# Patient Record
Sex: Female | Born: 1995 | Race: White | Hispanic: Yes | Marital: Single | Smoking: Never smoker
Health system: Southern US, Community
[De-identification: ages and names within clinical notes are randomized; demographics above are authoritative.]

## PROBLEM LIST (undated history)

## (undated) DIAGNOSIS — O139 Gestational [pregnancy-induced] hypertension without significant proteinuria, unspecified trimester: Secondary | ICD-10-CM

## (undated) DIAGNOSIS — O1494 Unspecified pre-eclampsia, complicating childbirth: Secondary | ICD-10-CM

## (undated) DIAGNOSIS — Z8759 Personal history of other complications of pregnancy, childbirth and the puerperium: Secondary | ICD-10-CM

## (undated) HISTORY — DX: Unspecified pre-eclampsia, complicating childbirth: O14.94

## (undated) HISTORY — DX: Gestational (pregnancy-induced) hypertension without significant proteinuria, unspecified trimester: O13.9

## (undated) HISTORY — DX: Personal history of other complications of pregnancy, childbirth and the puerperium: Z87.59

---

## 2018-08-17 LAB — HM HIV SCREENING LAB: HM HIV Screening: NEGATIVE

## 2019-02-02 ENCOUNTER — Ambulatory Visit (LOCAL_COMMUNITY_HEALTH_CENTER): Payer: Self-pay | Admitting: Physician Assistant

## 2019-02-02 ENCOUNTER — Other Ambulatory Visit: Payer: Self-pay

## 2019-02-02 ENCOUNTER — Encounter: Payer: Self-pay | Admitting: Physician Assistant

## 2019-02-02 VITALS — BP 105/65 | Ht 65.0 in | Wt 226.0 lb

## 2019-02-02 DIAGNOSIS — Z3046 Encounter for surveillance of implantable subdermal contraceptive: Secondary | ICD-10-CM

## 2019-02-02 DIAGNOSIS — Z3009 Encounter for other general counseling and advice on contraception: Secondary | ICD-10-CM

## 2019-02-02 DIAGNOSIS — Z309 Encounter for contraceptive management, unspecified: Secondary | ICD-10-CM

## 2019-02-02 MED ORDER — NORGESTIMATE-ETH ESTRADIOL 0.25-35 MG-MCG PO TABS: 1.0000 | ORAL_TABLET | Freq: Every day | ORAL | 0 refills | Status: DC

## 2019-02-02 NOTE — Progress Notes (Signed)
Family Planning Visit- Repeat Yearly Visit  Subjective:  Pamela Mack is a 23 y.o. being seen today for an well woman visit and to discuss family planning options.    She is currently using Nexplanon for pregnancy prevention. Patient reports she does not  want a pregnancy in the next year. Patient has the following medical conditionsdoes not have a problem list on file.  Chief Complaint  Patient presents with  . Contraception    irregular bleeding with Nexplanon    Patient reports that she got the Nexplanon placed about 3-4 months ago.  States that she has had light bleeding each day since insertion.  Reports that she has had irritation from using pads a lot.  Also, concerned that she has gained weight since insertion.  Is considering having Nexplanon removed.  Patient denies nausea, vomiting, abdominal pain, vaginal discharge, itching burning, change in soap/detergent and change of partner.   Does the patient desire a pregnancy in the next year? (OKQ flowsheet)  See flowsheet for other program required questions.   Body mass index is 37.61 kg/m. - Patient is eligible for diabetes screening based on BMI and age >14?  not applicable ER1V ordered? not applicable  Patient reports 1 of partners in last year. Desires STI screening?  No - .  Does the patient have a current or past history of drug use? No   No components found for: HCV]   Health Maintenance Due  Topic Date Due  . HIV Screening  07/28/2009  . TETANUS/TDAP  07/28/2013  . PAP-Cervical Cytology Screening  07/29/2015  . PAP SMEAR-Modifier  07/29/2015    Review of Systems  Constitutional: Negative.   HENT: Negative.   Cardiovascular: Negative.   Gastrointestinal: Negative.   Genitourinary: Negative.   Musculoskeletal: Negative.   Skin: Negative.   Neurological: Negative.   Psychiatric/Behavioral: Negative.     The following portions of the patient's history were reviewed and updated as appropriate:  allergies, current medications, past family history, past medical history, past social history, past surgical history and problem list. Problem list updated.  Objective:   Vitals:   02/02/19 1629  BP: 105/65  Weight: 226 lb (102.5 kg)  Height: 5\' 5"  (1.651 m)    Physical Exam Vitals signs reviewed.  Constitutional:      Appearance: Normal appearance.  HENT:     Head: Normocephalic and atraumatic.  Skin:    General: Skin is warm and dry.  Neurological:     Mental Status: She is alert and oriented to person, place, and time.  Psychiatric:        Mood and Affect: Mood normal.        Behavior: Behavior normal.    Patient declines pelvic exam today to assess for STDs.   Assessment and Plan:  Pamela Mack is a 23 y.o. female presenting to the [facility] Department for a family planning visit.  Contraception counseling: Reviewed all forms of birth control options available including abstinence; over the counter/barrier methods; hormonal contraceptive medication including pill, patch, ring, injection,contraceptive implant; hormonal and nonhormonal IUDs; permanent sterilization options including vasectomy and the various tubal sterilization modalities. Risks and benefits reviewed.  Questions were answered.  Written information was also given to the patient to review.  Patient desires to continue with Nexplanon as long as bleeding can be controlled, this was prescribed for patient. She will follow up in  3 months  for surveillance.  She was told to call with any further questions,  or with any concerns about this method of contraception.  Emphasized use of condoms 100% of the time for STI prevention.  There are no diagnoses linked to this encounter.  1.  Contraceptive counseling and management Counseled patient that it is normal to have irregular bleeding with the Nexplanon, especially for the first 6 months after insertion. Counseled that any BCM can affect appetite and  weight.  Rec well-balanced diet and regular exercise to maintain/lose weight. Counseled patient re:  Options to help with irregular bleeding and patient opts to try OCs for now Will give Sprintec 28d #3 1 po QD at the same time daily. Patient to start OCs today and rec take with food or milk to decrease nausea and dizziness as SE. Rec call back/ RTC for Nexplanon removal if continues to have irregular bleeding.  No follow-ups on file.  No future appointments.  Matt Holmesarla J Hampton, PA

## 2019-06-13 ENCOUNTER — Telehealth: Payer: Self-pay | Admitting: Family Medicine

## 2019-06-13 NOTE — Telephone Encounter (Signed)
Returned patient phone call with M. [place]), Astronomer. Patient states she had Nexplanon inserted here last January but now wants device removed. Patient states she has had irregular vaginal bleeding but now no period at all. States she has "belly cramps, nausea, dizziness and feelings of depression." Patient states she did not "have these things before." Patient states last sex was around 3 weeks ago. RN counseled patient to continue to not have sex until after completion of Nexplanon removal appt. Patient agreeable to above. States she would like to begin OCP's for alternate birth control. Nexplanon removal appt scheduled for 06/15/2019 @ 9:20. Instructed patient to arrive at 9:00 for check in. Hal Morales, RN

## 2019-06-13 NOTE — Telephone Encounter (Signed)
patient wants nexplanon removed

## 2019-06-15 ENCOUNTER — Ambulatory Visit (LOCAL_COMMUNITY_HEALTH_CENTER): Payer: Self-pay | Admitting: Family Medicine

## 2019-06-15 ENCOUNTER — Encounter: Payer: Self-pay | Admitting: Family Medicine

## 2019-06-15 ENCOUNTER — Other Ambulatory Visit: Payer: Self-pay

## 2019-06-15 VITALS — BP 118/70 | Ht 64.0 in | Wt 220.0 lb

## 2019-06-15 DIAGNOSIS — Z3046 Encounter for surveillance of implantable subdermal contraceptive: Secondary | ICD-10-CM

## 2019-06-15 DIAGNOSIS — Z3009 Encounter for other general counseling and advice on contraception: Secondary | ICD-10-CM

## 2019-06-15 DIAGNOSIS — Z30011 Encounter for initial prescription of contraceptive pills: Secondary | ICD-10-CM

## 2019-06-15 MED ORDER — NORGESTIM-ETH ESTRAD TRIPHASIC 0.18/0.215/0.25 MG-25 MCG PO TABS: 1.0000 | ORAL_TABLET | Freq: Every day | ORAL | 12 refills | Status: DC

## 2019-06-15 NOTE — Progress Notes (Signed)
Gave only #9 packs of Tri Lo Sprintec due to expire date/qty available. Pt to call for appt for OCP supply when she opens her last pack. Provider orders completed.

## 2019-06-15 NOTE — Progress Notes (Signed)
Last physical at [facility] and Nexplanon insertion at [place] was 08/17/2018. Pt desires Nexplanon removal, c/o belly has grown a lot and hurts a lot, having anxiety, and currently does not have a sexual partner. Last sex was 3 weeks ago. Declines condoms.

## 2019-06-15 NOTE — Progress Notes (Signed)
Nexplanon Removal Patient identified, informed consent performed, consent signed.   Appropriate time out taken. Nexplanon site identified.  Area prepped in usual sterile fashon. 3 ml of 1% lidocaine with Epinephrine was used to anesthetize the area at the distal end of the implant and along implant site. A small stab incision was made right beside the implant on the distal portion.  The Nexplanon rod was grasped manually and removed without difficulty.  There was minimal blood loss. There were no complications.  Steri-strips were applied over the small incision.  A pressure bandage was applied to reduce any bruising.  The patient tolerated the procedure well and was given post procedure instructions.  Nexplanon:   Counseled patient to take OTC analgesic starting as soon as lidocaine starts to wear off and take regularly for at least 48 hr to decrease discomfort.  Specifically to take with food or milk to decrease stomach upset and for IB 600 mg (3 tablets) every 6 hrs; IB 800 mg (4 tablets) every 8 hrs; or Aleve 2 tablets every 12 hrs. 1. General counseling and advice on contraceptive management   2. OCP (oral contraceptive pills) initiation  - Norgestimate-Ethinyl Estradiol Triphasic (ORTHO TRI-CYCLEN LO) 0.18/0.215/0.25 MG-25 MCG tab; Take 1 tablet by mouth daily.  Dispense: 1 Package; Refill: 12 Co to use condoms x 2 weeks for back up and always for STD prevention.

## 2019-08-29 ENCOUNTER — Other Ambulatory Visit: Payer: Self-pay

## 2019-08-29 ENCOUNTER — Ambulatory Visit (LOCAL_COMMUNITY_HEALTH_CENTER): Payer: Self-pay

## 2019-08-29 VITALS — BP 124/73 | Ht 64.0 in | Wt 216.0 lb

## 2019-08-29 DIAGNOSIS — Z3201 Encounter for pregnancy test, result positive: Secondary | ICD-10-CM

## 2019-08-29 MED ORDER — PRENATAL VITAMIN 27-0.8 MG PO TABS: 1.0000 | ORAL_TABLET | Freq: Every day | ORAL | 0 refills | Status: DC

## 2019-08-29 NOTE — Progress Notes (Signed)
Nexplanon removed at [facility] on11/18/2020 and per client, started ocps same day. Unable to remember if missed any ocps or took any late.Client did not start second pack of ocps in December because concerned was pregnant as having some dizziness and nausea. Per client took weight loss pills a few times when possibly pregnant. Encouraged to take to first [facility] appt for provider review. No NICR record available for client. Jossie Ng, RN

## 2019-08-30 LAB — PREGNANCY, URINE: Preg Test, Ur: POSITIVE — AB

## 2019-09-26 ENCOUNTER — Other Ambulatory Visit: Payer: Self-pay

## 2019-09-26 ENCOUNTER — Encounter: Payer: Self-pay | Admitting: Family Medicine

## 2019-09-26 ENCOUNTER — Ambulatory Visit: Payer: Medicaid Other | Admitting: Family Medicine

## 2019-09-26 VITALS — BP 112/65 | HR 85 | Temp 98.4°F | Wt 217.4 lb

## 2019-09-26 DIAGNOSIS — Z23 Encounter for immunization: Secondary | ICD-10-CM

## 2019-09-26 DIAGNOSIS — O09299 Supervision of pregnancy with other poor reproductive or obstetric history, unspecified trimester: Secondary | ICD-10-CM | POA: Diagnosis not present

## 2019-09-26 DIAGNOSIS — Z3480 Encounter for supervision of other normal pregnancy, unspecified trimester: Secondary | ICD-10-CM | POA: Diagnosis not present

## 2019-09-26 DIAGNOSIS — O99210 Obesity complicating pregnancy, unspecified trimester: Secondary | ICD-10-CM | POA: Diagnosis not present

## 2019-09-26 DIAGNOSIS — Z98891 History of uterine scar from previous surgery: Secondary | ICD-10-CM | POA: Insufficient documentation

## 2019-09-26 DIAGNOSIS — F439 Reaction to severe stress, unspecified: Secondary | ICD-10-CM

## 2019-09-26 DIAGNOSIS — O99211 Obesity complicating pregnancy, first trimester: Secondary | ICD-10-CM | POA: Insufficient documentation

## 2019-09-26 LAB — URINALYSIS
Bilirubin, UA: NEGATIVE
Glucose, UA: NEGATIVE
Ketones, UA: NEGATIVE
Leukocytes,UA: NEGATIVE
Nitrite, UA: NEGATIVE
Protein,UA: NEGATIVE
RBC, UA: NEGATIVE
Specific Gravity, UA: 1.02 (ref 1.005–1.030)
Urobilinogen, Ur: 0.2 mg/dL (ref 0.2–1.0)
pH, UA: 7 (ref 5.0–7.5)

## 2019-09-26 LAB — HEMOGLOBIN, FINGERSTICK: Hemoglobin: 12 g/dL (ref 11.1–15.9)

## 2019-09-26 LAB — WET PREP FOR TRICH, YEAST, CLUE
Trichomonas Exam: NEGATIVE
Yeast Exam: NEGATIVE

## 2019-09-26 LAB — HIV ANTIBODY (ROUTINE TESTING W REFLEX): HIV 1&2 Ab, 4th Generation: NONREACTIVE

## 2019-09-26 NOTE — Progress Notes (Signed)
Flu vaccine given, left deltoid, tolerated well, VIS given. [facility]. Referral faxed for 1st trimester screen, appointment pending, and patient aware that she will receive call about appointment day/time. Burt Knack, RN

## 2019-09-26 NOTE — Progress Notes (Signed)
[facility] HEALTH DEPT [facility] [address] [place] [address] [phone]  INITIAL PRENATAL VISIT NOTE  Subjective:  Pamela Mack is a 24 y.o. G3P2002 at [redacted]w[redacted]d being seen today to start prenatal care at the [facility] Department.  She is currently monitored for the following issues for this low-risk pregnancy and has History of 2 cesarean sections; Supervision of other normal pregnancy, antepartum; History of pre-eclampsia in prior pregnancy, currently pregnant; and Obesity affecting pregnancy in first trimester on their problem list.  Pt presents today for initial OB visit. She moved here 1 year ago from [place], currently living with her parents and her 2 children, ages 35 and 59. 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   She reports she has been having HA, occasional blurry vision and dizziness with this pregnancy. Taking 1 tylenol, helps a little.   She has no chronic medical issues, takes no medication other than PNV daily. She is unsure when her last pap was or what the result was.   She denies tobacco or substance use. Since finding out she was pregnant she has had no alcohol, last drink 1 month ago, drank 3-4 beers.   She has a hx of preeclampsia in the end of her 1st pregnancy, for which she had an emergency c/s. Also states she had intrapartum preeclampsia with second c/s (though this was not listed on c/s medical documentation received). Both births were 6#+.    Contractions: Not present. Vag. Bleeding: None.  Movement: Absent. Denies leaking of fluid.   Indications for ASA therapy (per uptodate) One of the following: Previous pregnancy with preeclampsia, especially early onset and with an adverse outcome Yes Multifetal gestation No Chronic hypertension No Type 1 or 2 diabetes mellitus No Chronic kidney disease No Autoimmune disease (antiphospholipid syndrome, systemic lupus erythematosus) No  Two or more of  the following: Nulliparity No Obesity (body mass index >30 kg/m2) Yes Family history of preeclampsia in mother or sister No Age ?35 years No Sociodemographic characteristics (African American race, low socioeconomic level) Yes Personal risk factors (eg, previous pregnancy with low birth weight or small for gestational age infant, previous adverse pregnancy outcome [eg, stillbirth], interval >10 years between pregnancies) No   The following portions of the patient's history were reviewed and updated as appropriate: allergies, current medications, past family history, past medical history, past social history, past surgical history and problem list. Problem list updated.  Objective:   Vitals:   09/26/19 1348  BP: 112/65  Pulse: 85  Temp: 98.4 F (36.9 C)  Weight: 217 lb 6.4 oz (98.6 kg)    Fetal Status: Fetal Heart Rate (bpm): unable to hear Fundal Height: 11 cm Movement: Absent  Presentation: Undeterminable   Physical Exam Vitals and nursing note reviewed.  Constitutional:      General: She is not in acute distress.    Appearance: Normal appearance. She is well-developed.  HENT:     Head: Normocephalic and atraumatic.     Right Ear: External ear normal.     Left Ear: External ear normal.     Nose: Nose normal. No congestion or rhinorrhea.     Mouth/Throat:     Lips: Pink.     Mouth: Mucous membranes are moist.     Dentition: Normal dentition. No dental caries.     Pharynx: Oropharynx is clear. Uvula midline.  Eyes:     General: No scleral icterus.    Conjunctiva/sclera: Conjunctivae normal.  Neck:     Thyroid: No thyroid mass or thyromegaly.  Cardiovascular:     Rate and Rhythm: Normal rate.     Pulses: Normal pulses.     Comments: Extremities are warm and well perfused Pulmonary:     Effort: Pulmonary effort is normal.     Breath sounds: Normal breath sounds.  Chest:     Breasts: Breasts are symmetrical.        Right: Normal. No mass, nipple discharge or skin  change.        Left: Normal. No mass, nipple discharge or skin change.  Abdominal:     General: Abdomen is flat.     Palpations: Abdomen is soft.     Tenderness: There is no abdominal tenderness.     Comments: Gravid   Genitourinary:    General: Normal vulva.     Exam position: Lithotomy position.     Pubic Area: No rash.      Labia:        Right: No rash.        Left: No rash.      Vagina: Vaginal discharge (scant, white) present.     Cervix: No cervical motion tenderness or friability.     Uterus: Normal. Enlarged (Gravid 11 wk size). Not tender.      Adnexa: Right adnexa normal and left adnexa normal.     Rectum: Normal. No external hemorrhoid.  Musculoskeletal:     Right lower leg: No edema.     Left lower leg: No edema.  Lymphadenopathy:     Upper Body:     Right upper body: No axillary adenopathy.     Left upper body: No axillary adenopathy.  Skin:    General: Skin is warm.     Capillary Refill: Capillary refill takes less than 2 seconds.  Neurological:     Mental Status: She is alert.     Assessment and Plan:  Pregnancy: G3P2002 at [redacted]w[redacted]d   1. Supervision of other normal pregnancy, antepartum -Initial prenatal visit today. -Unable to hear FHT, have referred today for viability/dating/1st trimester genetic screen (which pt elects).  -Regarding HA: advised rest, tylenol, stress reduction (see below). Low suspicion for preeclampsia this early in pregnancy, but as she has a hx am checking labs today. Advised to let us know if persists/worsens.  -Routine dental care recommended during pregnancy, handout of local dentists provided. -Pap collected today. She is unsure of last pap -Hgb today wnl. - HGB FRAC. W/SOLUBILITY - HIV  LAB - Lead, blood (adult age 62 yrs or greater) - CBC/D/Plt+RPR+Rh+ABO+Rub Ab... - Urine Culture - QuantiFERON-TB Gold Plus - WET PREP FOR TRICH, YEAST, CLUE - Hemoglobin, venipuncture - Urinalysis (Urine Dip) - PIH Panel ([facility]  [id number]) - IGP, rfx Aptima HPV ASCU  2. History of 2 cesarean sections -Pt desires repeat c/s and BTL.   3. Obesity affecting pregnancy in first trimester -Early GTT today, add'l labs as listed. -Healthy weight gain discussed. Pt declines MNT referral. -Pt accepts aspirin at 12 wks, handout given. -May consider serial growth scans starting at 28 wks. - Comprehensive metabolic panel - Glucose tolerance, 1 hour - Hgb A1c w/o eAG - Protein / creatinine ratio, urine - TSH  4. History of pre-eclampsia in prior pregnancy, currently pregnant -Urine today WNL. Will get UPCR and PIH labs.  -Pt accepts aspirin daily. Handout given.  -S/sx of preeclampsia discussed, advised to let us know/to ER asap if present.  5. Stress Pt endorses stress  w/move from [place]. Offered beh health referral, pt declines for now.     Discussed overview of care and coordination with inpatient delivery practices including [facility], [facility], [facility] and [facility].    Preterm labor symptoms and general obstetric precautions including but not limited to vaginal bleeding, contractions, leaking of fluid and fetal movement were reviewed in detail with the patient.  Please refer to After Visit Summary for other counseling recommendations.   Return in about 4 weeks (around 10/24/2019) for routine prenatal care.  Future Appointments  Date Time Provider [place]  10/24/2019  1:40 PM [facility] PROVIDER [facility] None    Kandee Keen, PA-C

## 2019-09-26 NOTE — Progress Notes (Signed)
Patient here for new OB appointment at about 11 3/7 weeks. States she has 2 children, 6 and 3, both by C-section. Both children born in [place], unsure of last Pap. States she gave her mothers address because that's where she prefers to receive mail. Taking PNV, states nausea has improved. Needs to be to lab by 3:00pm. States she has had some very bad headaches, dizzyness, spot in her vision, and blurry vision during this pregnancy. States this is currently happening during mornings and sometimes at night.Burt Knack, RN

## 2019-09-27 ENCOUNTER — Telehealth: Payer: Self-pay

## 2019-09-27 ENCOUNTER — Other Ambulatory Visit: Payer: Self-pay | Admitting: Family Medicine

## 2019-09-27 DIAGNOSIS — Z369 Encounter for antenatal screening, unspecified: Secondary | ICD-10-CM

## 2019-09-27 NOTE — Telephone Encounter (Signed)
TC to patient to inform of 1st trimester screen appointment at [facility] on 10/03/2019, GC 1:00pm, U/S 2:00pm. Patient aware, directions given. Interpreter M. [place]. Marland KitchenBurt Knack, RN

## 2019-09-28 LAB — IGP, RFX APTIMA HPV ASCU: PAP Smear Comment: 0

## 2019-09-28 LAB — PROTEIN / CREATININE RATIO, URINE
Creatinine, Urine: 100.5 mg/dL
Protein, Ur: 12.3 mg/dL
Protein/Creat Ratio: 122 mg/g creat (ref 0–200)

## 2019-09-28 LAB — URINE CULTURE: Organism ID, Bacteria: NO GROWTH

## 2019-09-28 LAB — CHLAMYDIA/GC NAA, CONFIRMATION
Chlamydia trachomatis, NAA: NEGATIVE
Neisseria gonorrhoeae, NAA: NEGATIVE

## 2019-10-03 ENCOUNTER — Ambulatory Visit
Admission: RE | Admit: 2019-10-03 | Discharge: 2019-10-03 | Disposition: A | Discharge status: Home / Self Care | Payer: Self-pay | Source: Ambulatory Visit | Attending: Family Medicine | Admitting: Family Medicine

## 2019-10-03 ENCOUNTER — Other Ambulatory Visit: Payer: Self-pay

## 2019-10-03 ENCOUNTER — Other Ambulatory Visit: Payer: Self-pay | Admitting: Family Medicine

## 2019-10-03 DIAGNOSIS — Z369 Encounter for antenatal screening, unspecified: Secondary | ICD-10-CM

## 2019-10-03 DIAGNOSIS — Z36 Encounter for antenatal screening for chromosomal anomalies: Secondary | ICD-10-CM

## 2019-10-03 DIAGNOSIS — Z3A15 15 weeks gestation of pregnancy: Secondary | ICD-10-CM | POA: Insufficient documentation

## 2019-10-03 LAB — COMPREHENSIVE METABOLIC PANEL
ALT: 7 IU/L (ref 0–32)
Albumin/Globulin Ratio: 1.5 (ref 1.2–2.2)
Albumin: 4 g/dL (ref 3.9–5.0)
Alkaline Phosphatase: 61 IU/L (ref 39–117)
BUN/Creatinine Ratio: 16 (ref 9–23)
Bilirubin Total: 0.3 mg/dL (ref 0.0–1.2)
CO2: 20 mmol/L (ref 20–29)
Calcium: 9.4 mg/dL (ref 8.7–10.2)
Chloride: 100 mmol/L (ref 96–106)
Globulin, Total: 2.7 g/dL (ref 1.5–4.5)
Glucose: 100 mg/dL — ABNORMAL HIGH (ref 65–99)
Potassium: 4.1 mmol/L (ref 3.5–5.2)
Sodium: 134 mmol/L (ref 134–144)
Total Protein: 6.7 g/dL (ref 6.0–8.5)

## 2019-10-03 LAB — QUANTIFERON-TB GOLD PLUS
QuantiFERON Mitogen Value: >10 IU/mL
QuantiFERON Nil Value: 0.15 IU/mL
QuantiFERON TB1 Ag Value: 0.47 IU/mL
QuantiFERON TB2 Ag Value: 0.47 IU/mL
QuantiFERON-TB Gold Plus: NEGATIVE

## 2019-10-03 LAB — CBC/D/PLT+RPR+RH+ABO+RUB AB...
Antibody Screen: NEGATIVE
Basophils Absolute: 0 10*3/uL (ref 0.0–0.2)
Basos: 0 %
EOS (ABSOLUTE): 0.2 10*3/uL (ref 0.0–0.4)
Eos: 2 %
Hematocrit: 35.1 % (ref 34.0–46.6)
Hemoglobin: 12.1 g/dL (ref 11.1–15.9)
Hepatitis B Surface Ag: NEGATIVE
Immature Grans (Abs): 0 10*3/uL (ref 0.0–0.1)
Immature Granulocytes: 0 %
Lymphocytes Absolute: 2.1 10*3/uL (ref 0.7–3.1)
Lymphs: 19 %
MCH: 29.4 pg (ref 26.6–33.0)
MCHC: 34.5 g/dL (ref 31.5–35.7)
MCV: 85 fL (ref 79–97)
Monocytes Absolute: 0.7 10*3/uL (ref 0.1–0.9)
Monocytes: 7 %
Neutrophils Absolute: 7.7 10*3/uL — ABNORMAL HIGH (ref 1.4–7.0)
Neutrophils: 72 %
Platelets: 283 10*3/uL (ref 150–450)
RBC: 4.11 x10E6/uL (ref 3.77–5.28)
RDW: 12.8 % (ref 11.7–15.4)
RPR Ser Ql: NONREACTIVE
Rh Factor: POSITIVE
Rubella Antibodies, IGG: 1.92 index (ref >0.99)
Varicella zoster IgG: 578 index (ref >165)
WBC: 10.7 10*3/uL (ref 3.4–10.8)

## 2019-10-03 LAB — AST+BUN+CREAT+LD+URIC A+HGB...
AST: 14 IU/L (ref 0–40)
BUN: 8 mg/dL (ref 6–20)
Creatinine, Ser: 0.5 mg/dL — ABNORMAL LOW (ref 0.57–1.00)
GFR calc Af Amer: 156 mL/min/{1.73_m2} (ref >59)
GFR calc non Af Amer: 135 mL/min/{1.73_m2} (ref >59)
LDH: 155 IU/L (ref 119–226)
Uric Acid: 3.1 mg/dL (ref 2.6–6.2)

## 2019-10-03 LAB — HGB FRACTIONATION CASCADE
Hgb A2: 2.4 % (ref 1.8–3.2)
Hgb A: 97 % (ref 96.4–98.8)
Hgb F: 0.6 % (ref 0.0–2.0)
Hgb S: 0 %

## 2019-10-03 LAB — LEAD, BLOOD (ADULT >= 16 YRS): Lead-Whole Blood: 1 ug/dL (ref 0–4)

## 2019-10-03 LAB — TSH: TSH: 1.1 u[IU]/mL (ref 0.450–4.500)

## 2019-10-03 LAB — COMMENT

## 2019-10-03 LAB — HGB A1C W/O EAG: Hgb A1c MFr Bld: 5.3 % (ref 4.8–5.6)

## 2019-10-03 LAB — GLUCOSE, 1 HOUR GESTATIONAL: Gestational Diabetes Screen: 99 mg/dL (ref 65–139)

## 2019-10-03 IMAGING — US US MFM OB LIMITED
1 series · 13 of 28 positions shown · non-contrast
Comparison: none

PATIENT INFO:

PERFORMED BY:
                   Sonographer
SERVICE(S) PROVIDED:
  US MFM OB COMP LESS THAN 14 WEEKS                    76801.4
 ----------------------------------------------------------------------
INDICATIONS:
  15 weeks gestation of pregnancy
FETAL EVALUATION:
 Num Of Fetuses:         1
 Fetal Heart Rate(bpm):  168
 Presentation:           Variable
 Placenta:               Anterior
BIOMETRY:
 BPD:      28.1  mm     G. Age:  15w 0d         43  %
OB HISTORY:
 Gravidity:    3         Term:   2
GESTATIONAL AGE:
 LMP:           12w 3d        Date:  07/08/19                 EDD:   04/13/20
 U/S Today:     15w 0d                                        EDD:   03/26/20
 Best:          15w 0d     Det. By:  U/S (10/03/19)           EDD:   03/26/20
ANATOMY:
 Choroid Plexus:        Within normal limits   Bladder:                Seen
                        for gestational age
 Stomach:               Seen                   Upper Extremities:      Visualized
 Abdominal Wall:        Within normal limits   Lower Extremities:      Visualized
CERVIX UTERUS ADNEXA:
 Left Ovary
 Size(cm)       3.5  x    2     x  2.7       Vol(ml):
 Right Ovary
 Size(cm)       3.4  x   1.5    x  2.3       Vol(ml):

[Series 1: us mfm ob limited · 0.23mm/px · 13 of 35 slices shown]
[im 2/35]
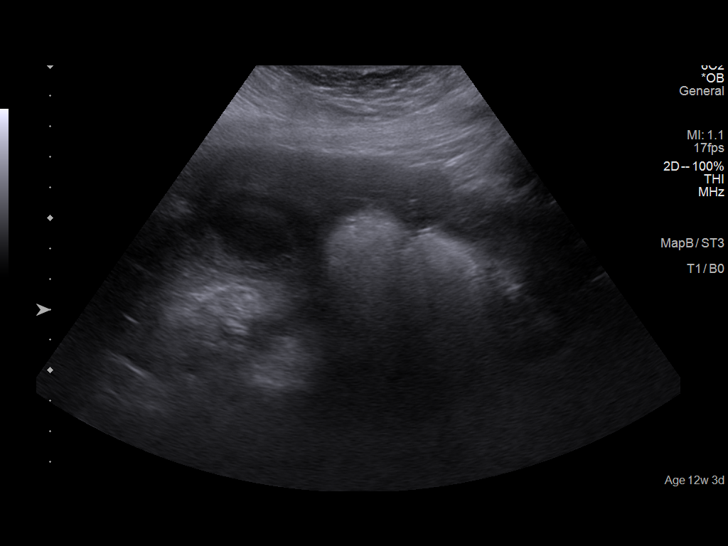
[im 4/35]
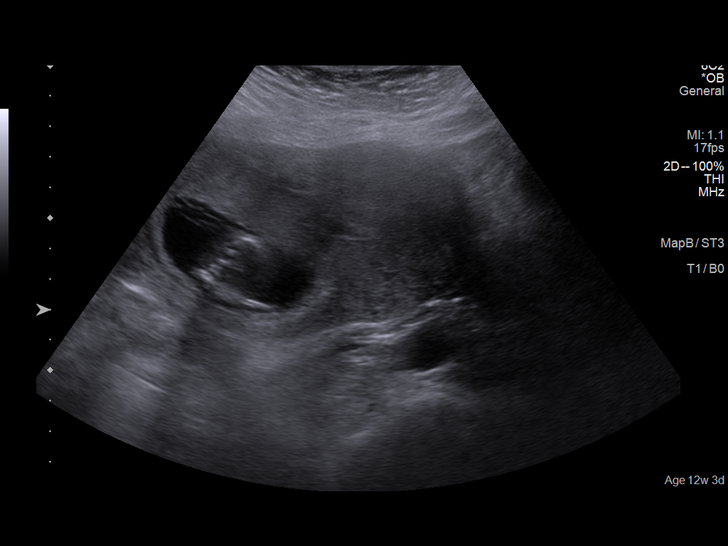
[im 7/35]
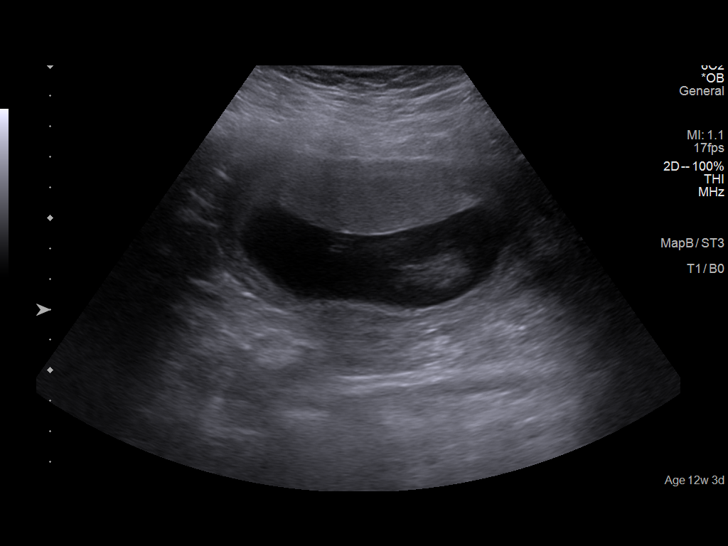
[im 9/35]
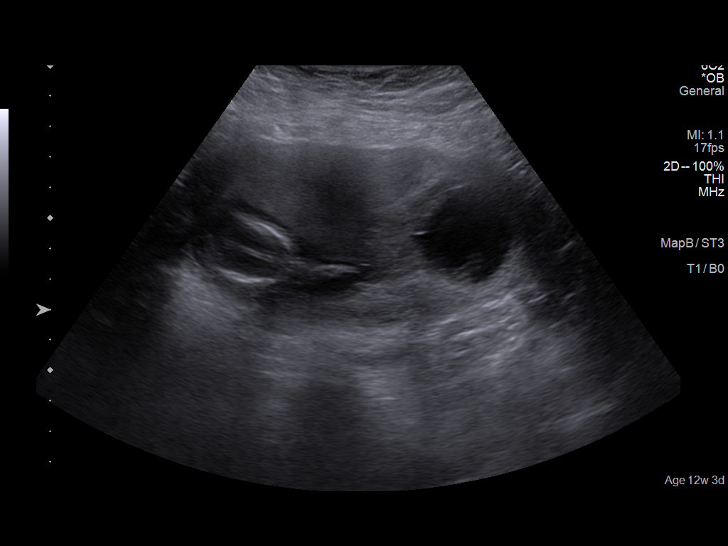
[im 12/35]
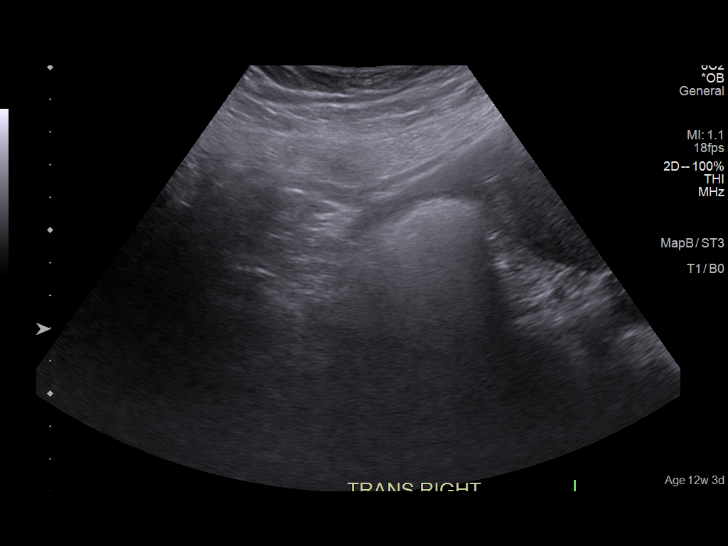
[im 14/35]
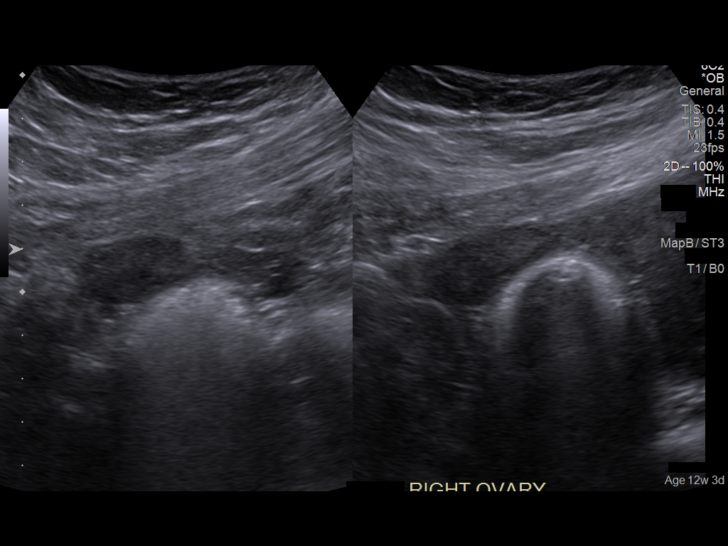
[im 18/35]
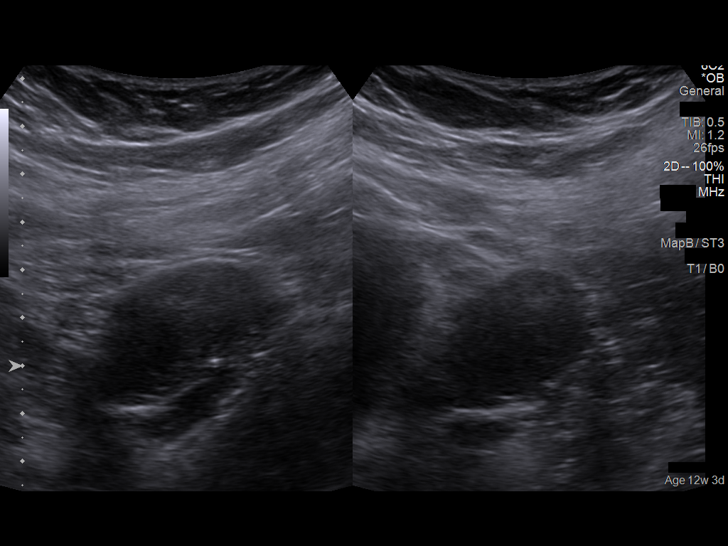
[im 21/35]
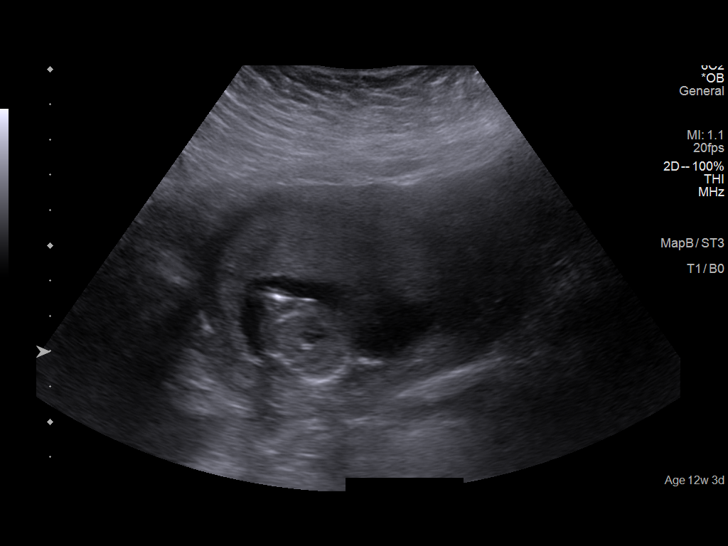
[im 23/35]
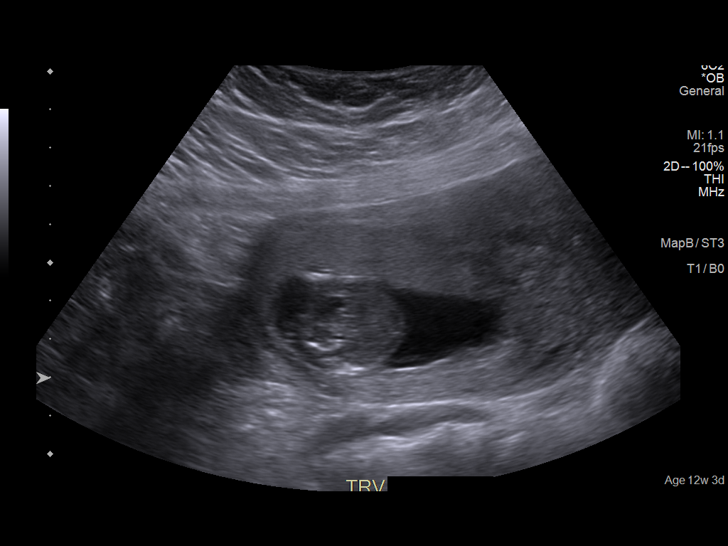
[im 26/35]
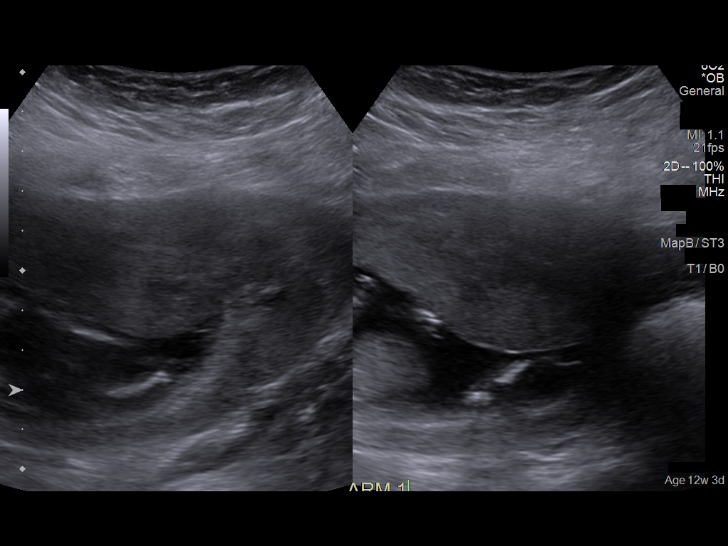
[im 28/35]
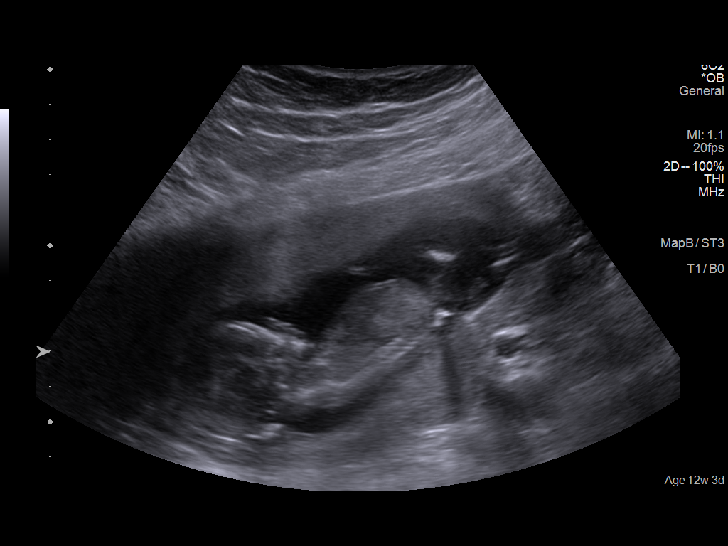
[im 31/35]
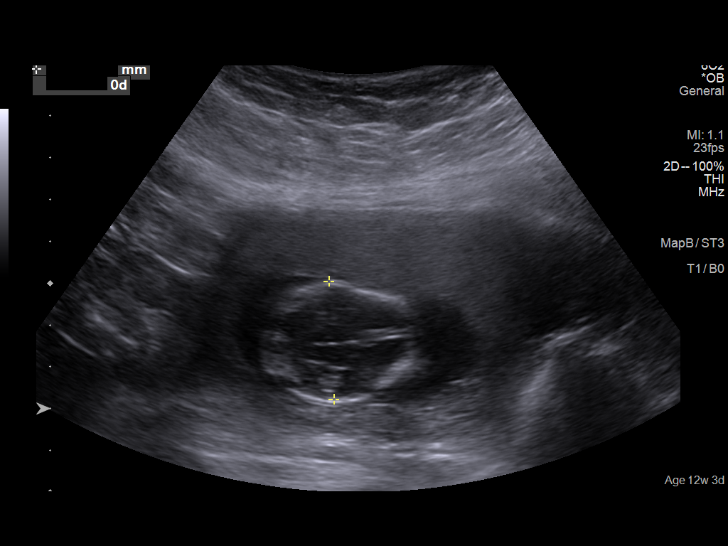
[im 33/35]
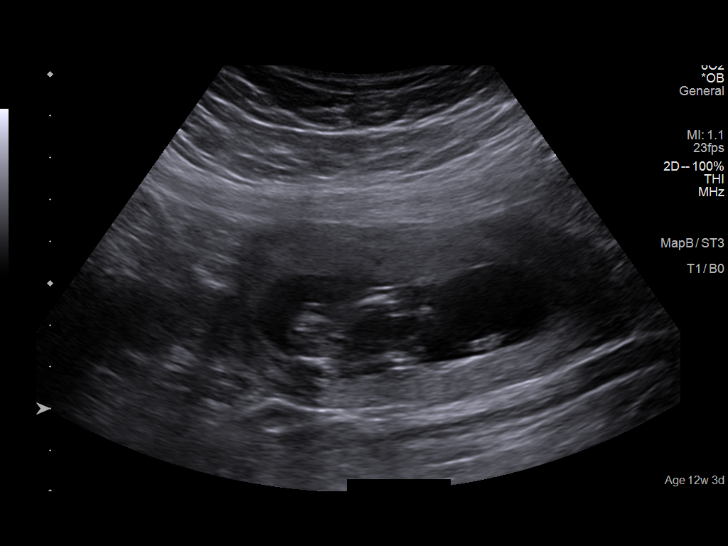

[13 of 28 positions shown; findings below may reference images not displayed]

IMPRESSION: Thank you for referring your patient for ultrasound and
 aneuploidy screening.   She had the opportunity to meet with
 our genetic counselor today.  Please see that note for details.

 Ultrasound demonstrates a single, live, fetus at 15 weeks.
 Dating is by today's ultrasound due to unsure LMP.
 Limited anatomic survey demonstrates a symmetric choroid
 plexus, fetal bladder, stomach, and limbs.  The gestational
 age is too early for a complete anatomic survey.
 Maternal ovaries appear normal bilaterally.
 Recommend anatomic survey in 3 weeks (scheduled).
 Thank you for allowing us to participate in your patient's care.
                  [place], Marie Holm

## 2019-10-03 NOTE — Progress Notes (Addendum)
Staples, Hulda Humphrey Length of Consultation: 20 minutes   Pamela Mack  was referred to [facility] [place] for genetic counseling to review prenatal screening and testing options.  This note summarizes the information we discussed with the aid of a Spanish interpreter.   We offered the following routine screening tests for this pregnancy:  Cell free fetal DNA testing from maternal blood may be used to determine whether or not the baby may have either Down syndrome, trisomy 67, or trisomy 39.  This test utilizes a maternal blood sample and DNA sequencing technology to isolate circulating cell free fetal DNA from maternal plasma.  The fetal DNA can then be analyzed for DNA sequences that are derived from the three most common chromosomes involved in aneuploidy, chromosomes 13, 18, and 21.  If the overall amount of DNA is greater than the expected level for any of these chromosomes, aneuploidy is suspected.  While we do not consider it a replacement for invasive testing and karyotype analysis, a negative result from this testing would be reassuring, though not a guarantee of a normal chromosome complement for the baby.  An abnormal result is certainly suggestive of an abnormal chromosome complement, though we would still recommend CVS or amniocentesis to confirm any findings from this testing.  First trimester screening, which includes nuchal translucency ultrasound screen and first trimester maternal serum marker screening.  The nuchal translucency has approximately an 80% detection rate for Down syndrome and can be positive for other chromosome abnormalities as well as congenital heart defects.  When combined with a maternal serum marker screening, the detection rate is up to 90% for Down syndrome and up to 97% for trisomy 18.  Due to new recommendations during COVID, we are offering first trimester screening with the biochemical testing only, which has a lower detection rate than  cell free fetal DNA testing.   Maternal serum marker screening, a blood test that measures pregnancy proteins, can provide risk assessments for Down syndrome, trisomy 18, and open neural tube defects (spina bifida, anencephaly). Because it does not directly examine the fetus, it cannot positively diagnose or rule out these problems.  Targeted ultrasound uses high frequency sound waves to create an image of the developing fetus.  An ultrasound is often recommended as a routine means of evaluating the pregnancy.  It is also used to screen for fetal anatomy problems (for example, a heart defect) that might be suggestive of a chromosomal or other abnormality.   Should these screening tests indicate an increased concern, then the following additional testing options would be offered:  The chorionic villus sampling procedure is available for first trimester chromosome analysis.  This involves the withdrawal of a small amount of chorionic villi (tissue from the developing placenta).  Risk of pregnancy loss is estimated to be approximately 1 in 200 to 1 in 100 (0.5 to 1%).  There is approximately a 1% (1 in 100) chance that the CVS chromosome results will be unclear.  Chorionic villi cannot be tested for neural tube defects.     Amniocentesis involves the removal of a small amount of amniotic fluid from the sac surrounding the fetus with the use of a thin needle inserted through the maternal abdomen and uterus.  Ultrasound guidance is used throughout the procedure.  Fetal cells from amniotic fluid are directly evaluated and > 99.5% of chromosome problems and > 98% of open neural tube defects can be detected. This procedure is generally performed after the 15th  week of pregnancy.  The main risks to this procedure include complications leading to miscarriage in less than 1 in 200 cases (0.5%).   Cystic Fibrosis and Spinal Muscular Atrophy (SMA) screening were also discussed with the patient. Both conditions are  recessive, which means that both parents must be carriers in order to have a child with the disease.  Cystic fibrosis (CF) is one of the most common genetic conditions in persons of Caucasian ancestry.  This condition occurs in approximately 1 in 2,500 Caucasian persons and results in thickened secretions in the lungs, digestive, and reproductive systems.  For a baby to be at risk for having CF, both of the parents must be carriers for this condition.  Approximately 1 in 61 Caucasian persons is a carrier for CF.  Current carrier testing looks for the most common mutations in the gene for CF and can detect approximately 90% of carriers in the Caucasian population.  This means that the carrier screening can greatly reduce, but cannot eliminate, the chance for an individual to have a child with CF.  If an individual is found to be a carrier for CF, then carrier testing would be available for the partner. As part of [place]'s newborn screening profile, all babies born in the state of [place] will have a two-tier screening process.  Specimens are first tested to determine the concentration of immunoreactive trypsinogen (IRT).  The top 5% of specimens with the highest IRT values then undergo DNA testing using a panel of over 40 common CF mutations. SMA is a neurodegenerative disorder that leads to atrophy of skeletal muscle and overall weakness.  This condition is also more prevalent in the Caucasian population, with 1 in 40-1 in 60 persons being a carrier and 1 in 6,000-1 in 10,000 children being affected.  There are multiple forms of the disease, with some causing death in infancy to other forms with survival into adulthood.  The genetics of SMA is complex, but carrier screening can detect up to 95% of carriers in the Caucasian population.  Similar to CF, a negative result can greatly reduce, but cannot eliminate, the chance to have a child with SMA. Hemoglobinopathy screening was also offered. The patient  is from [place] and her partner is from [place].  We obtained a detailed family history and pregnancy history.  The family history was reported to be unremarkable for birth defects, intellectual delays, recurrent pregnancy loss or known chromosome abnormalities.  Ms. Zianne Schubring stated that this is her third pregnancy. She has one healthy son (26 years old) from a prior relationship.  She and her current partner have a healthy 18 year old daughter.  She reported no complications or exposures in this pregnancy that would be expected to increase the risk for birth defects.  After consideration of the options, Pamela Mack elected to proceed with cell free fetal DNA testing as well as carrier screening for CF, SMA and hemoglobinopathies.  An ultrasound was performed at the time of the visit.  The gestational age was consistent with  15 weeks.  Fetal anatomy could not be assessed due to early gestational age.  Please refer to the ultrasound report for details of that study. She was scheduled to return to clinic in 3 weeks for an anatomy ultrasound.  Ms. Quinlan Mcfall was encouraged to call with questions or concerns.  We can be contacted at [phone].  Labs ordered: MaterniT21 PLUS with SCA, Inheritest CF/SMA, hemoglobinopathy fractionation  Cherly Anderson,  MS, CGC

## 2019-10-04 ENCOUNTER — Encounter: Payer: Self-pay | Admitting: Family Medicine

## 2019-10-04 NOTE — Progress Notes (Signed)
HIV results abstracted. Jakerra Floyd, RN  

## 2019-10-06 LAB — HGB FRACTIONATION CASCADE
Hgb A2: 2.4 % (ref 1.8–3.2)
Hgb A: 97 % (ref 96.4–98.8)
Hgb F: 0.6 % (ref 0.0–2.0)
Hgb S: 0 %

## 2019-10-08 LAB — MATERNIT21 PLUS CORE+SCA
Fetal Fraction: 9
Monosomy X (Turner Syndrome): NOT DETECTED
Result (T21): NEGATIVE
Trisomy 13 (Patau syndrome): NEGATIVE
Trisomy 18 (Edwards syndrome): NEGATIVE
Trisomy 21 (Down syndrome): NEGATIVE
XXX (Triple X Syndrome): NOT DETECTED
XXY (Klinefelter Syndrome): NOT DETECTED
XYY (Jacobs Syndrome): NOT DETECTED

## 2019-10-10 ENCOUNTER — Telehealth: Payer: Self-pay | Admitting: Obstetrics and Gynecology

## 2019-10-10 NOTE — Telephone Encounter (Signed)
The patient was informed of the results of her recent MaterniT21 testing which yielded NEGATIVE results.  The patient's specimen showed DNA consistent with two copies of chromosomes 21, 18 and 13.  The sensitivity for trisomy 36, trisomy 63 and trisomy 61 using this testing are reported as 99.1%, 99.9% and 91.7% respectively.  Thus, while the results of this testing are highly accurate, they are not considered diagnostic at this time.  Should more definitive information be desired, the patient may still consider amniocentesis.   As requested to know by the patient, sex chromosome analysis was included for this sample.  Results were placed in an envelope and mailed to the patient per her request  This is predicted with >99% accuracy.  A maternal serum AFP only should be considered if screening for neural tube defects is desired.  Results of the hemoglobinopathy screening showed normal adult hemoglobin (AA).  Carrier testing for CF and SMA are still pending.  We may be reached at [phone] with any questions or concerns.  Cherly Anderson, MS, CGC

## 2019-10-14 LAB — MISC LABCORP TEST (SEND OUT): Labcorp test code: 452172

## 2019-10-17 ENCOUNTER — Telehealth: Payer: Self-pay | Admitting: Obstetrics and Gynecology

## 2019-10-17 NOTE — Telephone Encounter (Signed)
  We informed Pamela Mack that the results of the recent screening test for Cystic fibrosis (CF) and Spinal Muscular Atrophy (SMA) are now available.  A Spanish interpreter was utilized for this call.  To review, CF is a genetic condition that occurs most often in Caucasian persons.  It primarily affects the lungs, digestive, and reproductive systems.  For someone to be at risk for having CF, both of their parents must be carriers for CF.  The testing can detect many persons who are carriers for CF and therefore determine if the pregnancy is at an increased risk for this condition.  The blood test results were negative when examined for the 97 most common mutations (or changes) in the gene for CF.  This means that she does not carry any of the most common changes in this gene.  Testing for these 97 mutations detects approximately 78% of carriers who are Hispanic.  Therefore, the chance that she is a carrier based on this negative result has been reduced from 1 in 58 to approximately 1 in 260.  Because this testing cannot detect all changes that may cause CF, we cannot eliminate the chance that this individual is a carrier completely.  The results of the SMA carrier screening are also available.  SMA is also a recessive genetic condition with variable age of onset and severity caused by mutations in the SMN1 gene.  This carrier testing assesses the number of copies of this gene.  Persons with one copy of the SMN1 gene are carriers, and those with no copies are affected with the condition.  Individuals with two or more copies have a reduced chance to be a carrier.  Not all mutations can be detected with this testing, though it can detect 92.6% of carriers in the Hispanic population.  The results revealed that [name] has an SMN1 copy number of 2 and is negative for the c. *3+80T>G SNP, thus reducing her chance to be a carrier from 1 in 68 to 1 in 906.  Again, this testing cannot eliminate the chance  to have a child with SMA, but dramatically reduces the chance.    We encouraged the patient to call with any questions or concerns as they arise.  We may be reached at [phone].  Cherly Anderson, MS, CGC

## 2019-10-20 ENCOUNTER — Other Ambulatory Visit: Payer: Self-pay | Admitting: Maternal & Fetal Medicine

## 2019-10-20 DIAGNOSIS — Z3689 Encounter for other specified antenatal screening: Secondary | ICD-10-CM

## 2019-10-20 NOTE — Addendum Note (Signed)
Addended by: Heywood Bene on: 10/20/2019 11:24 AM   Modules accepted: Orders

## 2019-10-24 ENCOUNTER — Encounter: Payer: Self-pay | Admitting: Family Medicine

## 2019-10-24 ENCOUNTER — Ambulatory Visit
Admission: RE | Admit: 2019-10-24 | Discharge: 2019-10-24 | Disposition: A | Discharge status: Home / Self Care | Payer: Self-pay | Source: Ambulatory Visit | Attending: Obstetrics and Gynecology | Admitting: Obstetrics and Gynecology

## 2019-10-24 ENCOUNTER — Ambulatory Visit: Payer: Self-pay | Admitting: Family Medicine

## 2019-10-24 ENCOUNTER — Other Ambulatory Visit: Payer: Self-pay

## 2019-10-24 VITALS — BP 108/66 | HR 88 | Temp 97.4°F | Wt 219.2 lb

## 2019-10-24 DIAGNOSIS — Z3480 Encounter for supervision of other normal pregnancy, unspecified trimester: Secondary | ICD-10-CM

## 2019-10-24 DIAGNOSIS — O99211 Obesity complicating pregnancy, first trimester: Secondary | ICD-10-CM

## 2019-10-24 DIAGNOSIS — Z3689 Encounter for other specified antenatal screening: Secondary | ICD-10-CM | POA: Insufficient documentation

## 2019-10-24 DIAGNOSIS — E669 Obesity, unspecified: Secondary | ICD-10-CM | POA: Insufficient documentation

## 2019-10-24 DIAGNOSIS — O09299 Supervision of pregnancy with other poor reproductive or obstetric history, unspecified trimester: Secondary | ICD-10-CM

## 2019-10-24 DIAGNOSIS — O99212 Obesity complicating pregnancy, second trimester: Secondary | ICD-10-CM | POA: Insufficient documentation

## 2019-10-24 DIAGNOSIS — Z3A18 18 weeks gestation of pregnancy: Secondary | ICD-10-CM | POA: Insufficient documentation

## 2019-10-24 DIAGNOSIS — Z98891 History of uterine scar from previous surgery: Secondary | ICD-10-CM

## 2019-10-24 IMAGING — US US MFM OB COMP +14 WKS
1 series · 12 of 28 positions shown · non-contrast
Comparison: none

PATIENT INFO:

PERFORMED BY:
SERVICE(S) PROVIDED:
 ----------------------------------------------------------------------
INDICATIONS:
  18 weeks gestation of pregnancy
FETAL EVALUATION:
 Num Of Fetuses:         1
 Fetal Heart Rate(bpm):  152
 Cardiac Activity:       Present
 Presentation:           Vertex
 Placenta:               Anterior
BIOMETRY:
 BPD:      36.1  mm     G. Age:  17w 0d         13  %    CI:        72.18   %    70 - 86
                                                         FL/HC:      17.2   %    15.8 - 18
 HC:      135.2  mm     G. Age:  17w 0d          6  %
                                                         FL/BPD:     64.5   %
 FL:       23.3  mm     G. Age:  17w 0d         13  %
 HUM:      22.2  mm     G. Age:  16w 5d         19  %
 NFT:       3.1  mm
 CM:        2.2  mm
OB HISTORY:
 Gravidity:    3         Term:   2
GESTATIONAL AGE:
 LMP:           15w 3d        Date:  07/08/19                 EDD:   04/13/20
 U/S Today:     17w 0d                                        EDD:   04/02/20
 Best:          18w 0d     Det. By:  U/S  (10/03/19)          EDD:   03/26/20
ANATOMY:
 Cavum:                 Suboptimal             Aortic Arch:            Normal appearance
 Ventricles:            Normal appearance      Ductal Arch:            Not visualized
 Choroid Plexus:        Within Normal Limits   Diaphragm:              Within Normal Limits
 Cerebellum:            Within Normal Limits   Stomach:                Seen
 Posterior Fossa:       Within Normal Limits   Abdomen:                Within Normal
                                                                       Limits
 Nuchal Fold:           Within Normal Limits   Abdominal Wall:         Normal appearance
 Face:                  Orbits visualized      Cord Vessels:           3 vessels
 Lips:                  Normal appearance      Kidneys:                Normal appearance
                        Not seen
 Thoracic:              Within Normal Limits   Bladder:                Seen
 Heart:                 4-Chamber view         Spine:                  Suboptimal views
                        appears normal
 RVOT:                  Normal appearance      Upper Extremities:      Visualized
 LVOT:                  Normal appearance      Lower Extremities:      Visualized
CERVIX UTERUS ADNEXA:
 Cervix
 Length:           3.74  cm.

[Series 1: us mfm ob comp +14 wks · 0.20mm/px · 65 acquisitions, 12 frames shown]
[im 3/65]
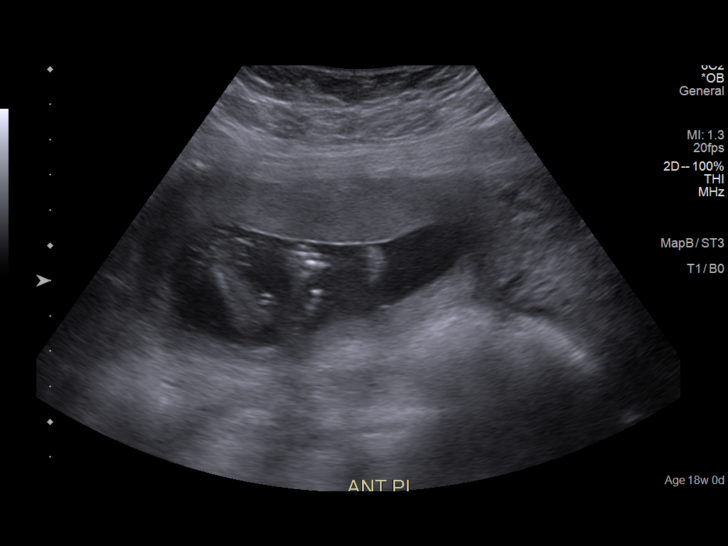
[im 8/65]
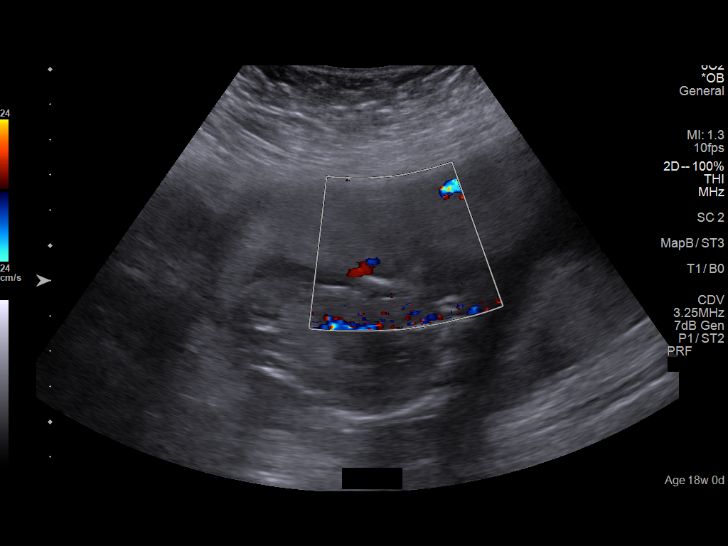
[im 12/65]
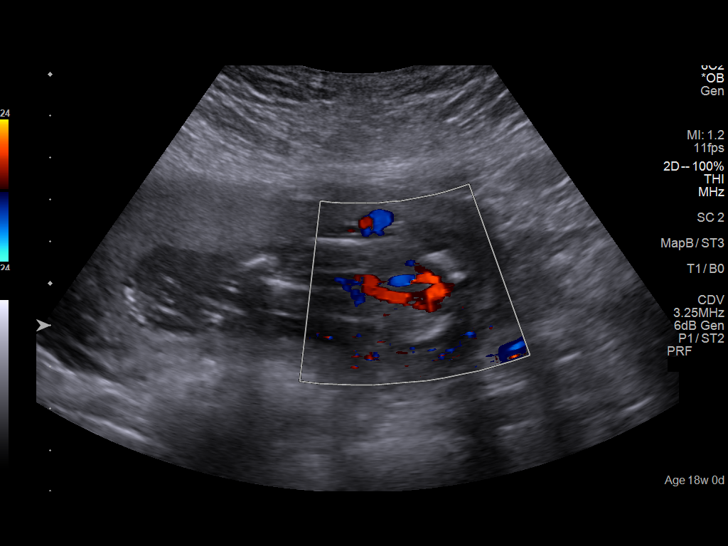
[im 19/65]
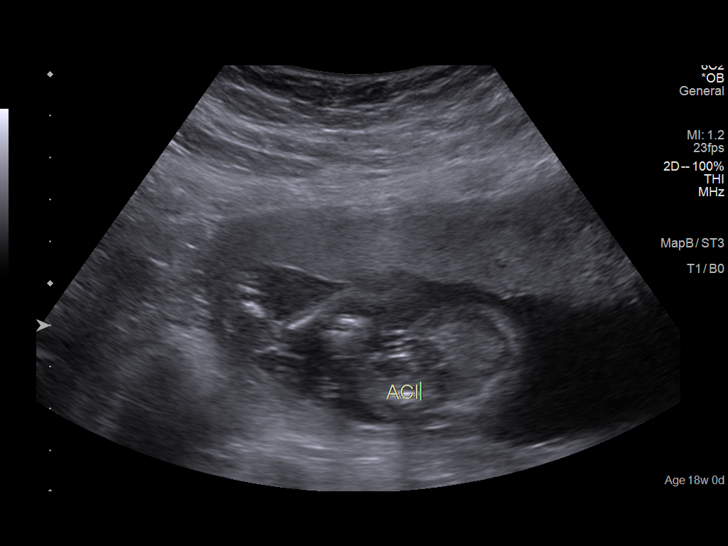
[im 24/65]
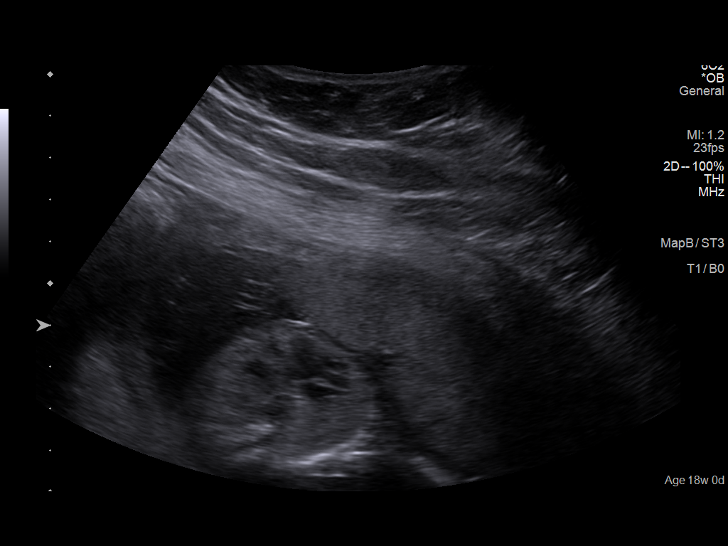
[im 29/65]
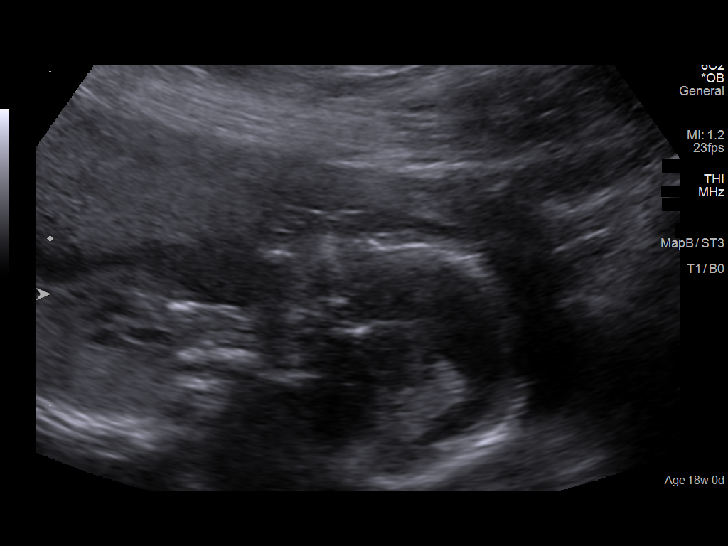
[im 36/65]
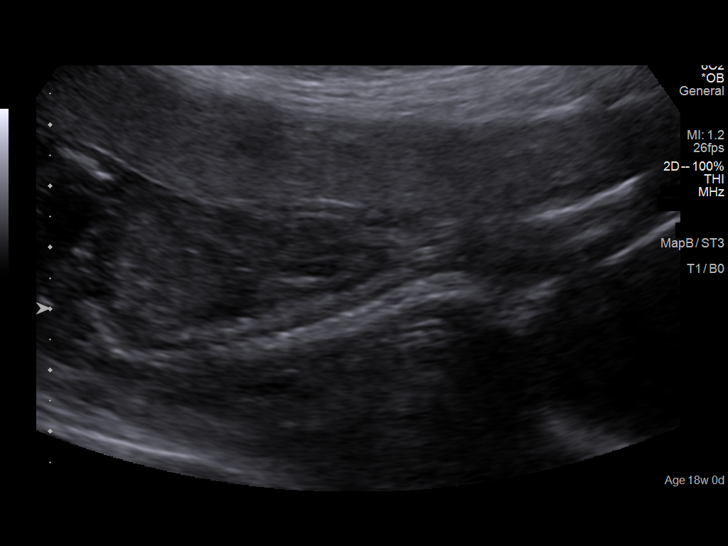
[im 41/65]
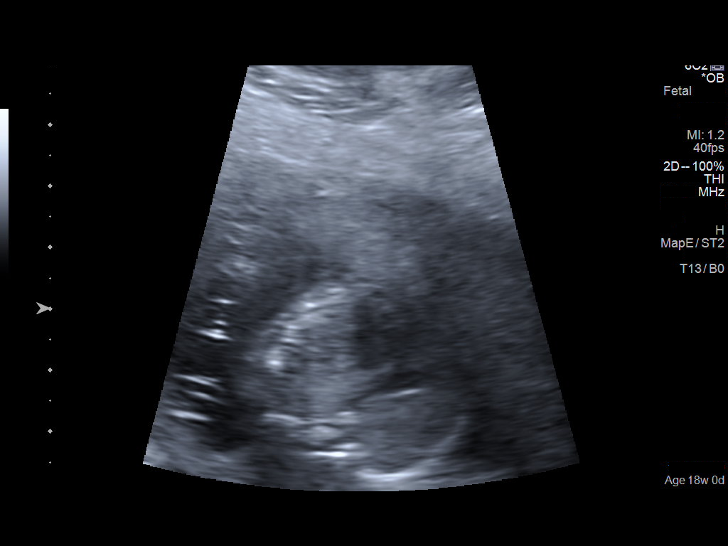
[im 46/65]
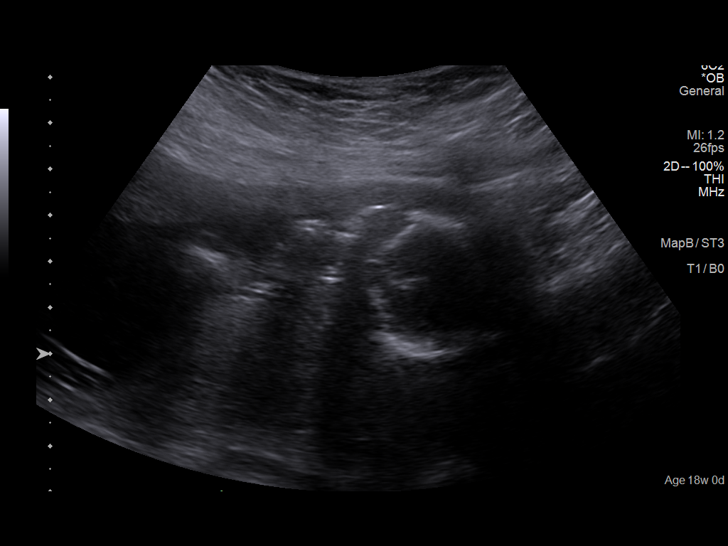
[im 53/65]
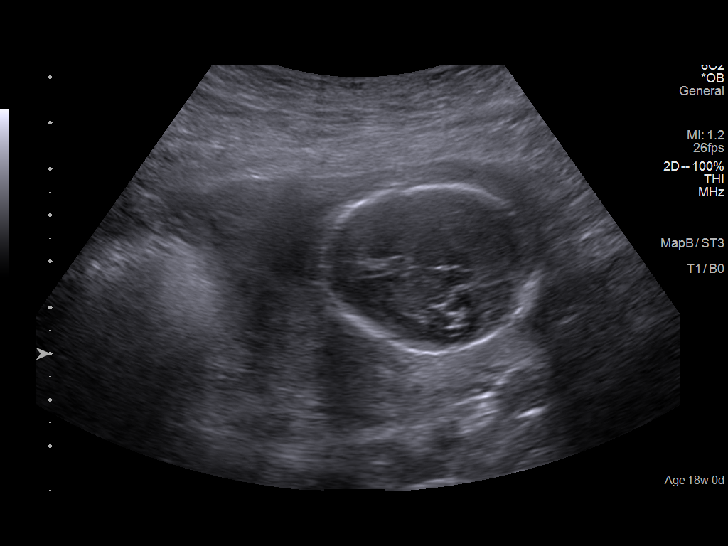
[im 57/65]
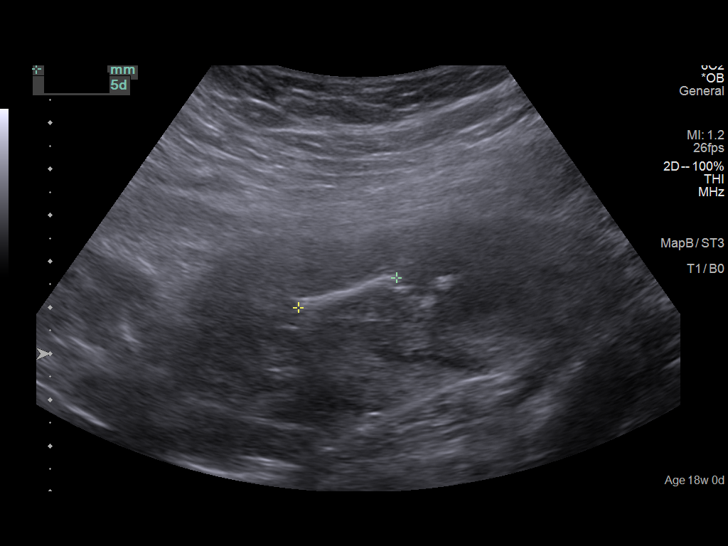
[im 62/65]
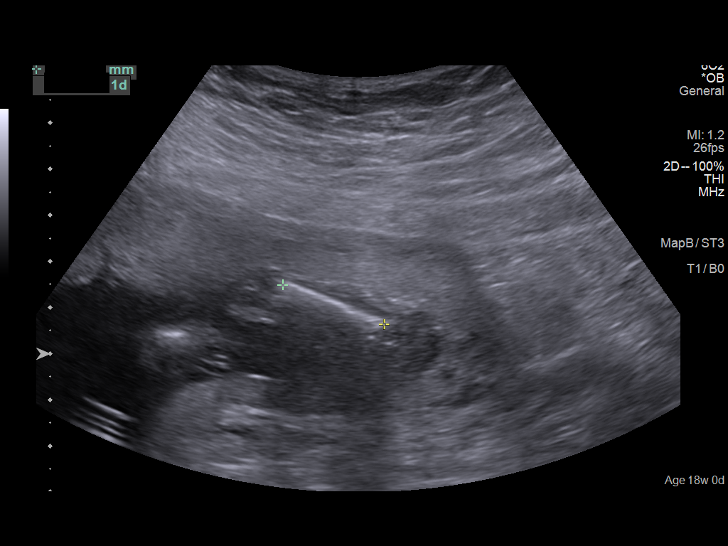

[12 of 28 positions shown; findings below may reference images not displayed]

IMPRESSION: Dear Dr.   KYE SOOK,

 Thank you for referring your patient  for a fetal anatomical
 survey due to maternal obesity. She had negative cell free
 DNA screening .
 She is dated off of a BPD obtained on 10/03/19 at 15 weeks
 making her now at 18 weeks 0 d.

 There is a singleton gestation with subjectively normal
 amniotic fluid volume.

 The fetal biometry correlates with established dating.

 Detailed evaluation of the fetal anatomy was performed.  The
 heart views, profile , CSP,  spine, nose/ lips . The remainder
 of the  fetal anatomical survey appears within normal limits
 within the resolution of ultrasound as described above.
 An additional scan is advised between 20-22 weeks to
 complete the anatomical survey and increase the
 malformation detection rate provided by today's ultrasound
 evaluation. this was scheduled.
 Patietn c/o UTI sxs and a specimen was sent. pt will call the
 [facility] for results.

 Thank you for allowing us to participate in your patient's care.

 assistance.

## 2019-10-24 NOTE — Progress Notes (Signed)
  PRENATAL VISIT NOTE  Subjective:  Pamela Mack is a 24 y.o. G3P2002 at [redacted]w[redacted]d being seen today for ongoing prenatal care.  She is currently monitored for the following issues for this high-risk pregnancy and has History of 2 cesarean sections; Supervision of other normal pregnancy, antepartum; History of pre-eclampsia in prior pregnancy, currently pregnant; and Obesity affecting pregnancy in first trimester on their problem list.  Patient reports no complaints.  Contractions: Not present. Vag. Bleeding: None.  Movement: Present. Denies leaking of fluid/ROM.   The following portions of the patient's history were reviewed and updated as appropriate: allergies, current medications, past family history, past medical history, past social history, past surgical history and problem list. Problem list updated.  Objective:   Vitals:   10/24/19 1333  BP: 108/66  Pulse: 88  Temp: (!) 97.4 F (36.3 C)  Weight: 219 lb 3.2 oz (99.4 kg)    Fetal Status: Fetal Heart Rate (bpm): 150 Fundal Height: 18 cm Movement: Present     General:  Alert, oriented and cooperative. Patient is in no acute distress.  Skin: Skin is warm and dry. No rash noted.   Cardiovascular: Normal heart rate noted  Respiratory: Normal respiratory effort, no problems with respiration noted  Abdomen: Soft, gravid, appropriate for gestational age.  Pain/Pressure: Absent     Pelvic: Cervical exam deferred        Extremities: Normal range of motion.  Edema: None  Mental Status: Normal mood and affect. Normal behavior. Normal judgment and thought content.   Assessment and Plan:  Pregnancy: G3P2002 at [redacted]w[redacted]d   1. Supervision of other normal pregnancy, antepartum -She has anatomy US w/[facility] today at 3pm.  -1st trimester screen WNL, pt elects for AFP today. - AFP Only [facility]- Scanned result  2. History of pre-eclampsia in prior pregnancy, currently pregnant -BP wnl today. Taking aspirin.   3. Obesity affecting pregnancy in  first trimester -TWG = 24 lb 8 oz (11.1 kg) per pt report of pregravid wt, though she has only gained 2 lbs in last 4 wks. Declines MNT referral.  4. History of 2 cesarean sections -Pt desires repeat c/s and BTL.    Preterm labor symptoms and general obstetric precautions including but not limited to vaginal bleeding, contractions, leaking of fluid and fetal movement were reviewed in detail with the patient. Please refer to After Visit Summary for other counseling recommendations.  Return in about 4 weeks (around 11/21/2019) for routine prenatal care.  Future Appointments  Date Time Provider Department Center  11/21/2019  1:40 PM [facility] PROVIDER [facility] None    Ann Held, PA-C

## 2019-10-24 NOTE — Progress Notes (Signed)
Patient here for MH RV at 18 weeks. Desires AFP only today. NCIR given.Burt Knack, RN

## 2019-10-25 LAB — URINE CULTURE: Culture: <10000 — AB

## 2019-11-14 ENCOUNTER — Other Ambulatory Visit: Payer: Self-pay | Admitting: Obstetrics and Gynecology

## 2019-11-14 DIAGNOSIS — O99212 Obesity complicating pregnancy, second trimester: Secondary | ICD-10-CM

## 2019-11-17 ENCOUNTER — Other Ambulatory Visit: Payer: Self-pay

## 2019-11-17 ENCOUNTER — Ambulatory Visit
Admission: RE | Admit: 2019-11-17 | Discharge: 2019-11-17 | Disposition: A | Discharge status: Home / Self Care | Payer: Self-pay | Source: Ambulatory Visit | Attending: Obstetrics and Gynecology | Admitting: Obstetrics and Gynecology

## 2019-11-17 DIAGNOSIS — Z3A21 21 weeks gestation of pregnancy: Secondary | ICD-10-CM | POA: Insufficient documentation

## 2019-11-17 DIAGNOSIS — O99212 Obesity complicating pregnancy, second trimester: Secondary | ICD-10-CM | POA: Insufficient documentation

## 2019-11-17 DIAGNOSIS — E669 Obesity, unspecified: Secondary | ICD-10-CM | POA: Insufficient documentation

## 2019-11-17 DIAGNOSIS — Z362 Encounter for other antenatal screening follow-up: Secondary | ICD-10-CM | POA: Insufficient documentation

## 2019-11-17 IMAGING — US US MFM OB FOLLOW-UP
1 series · 12 of 28 positions shown · non-contrast
Comparison: none

PATIENT INFO:

PERFORMED BY:
                   Sonographer
SERVICE(S) PROVIDED:
 ----------------------------------------------------------------------
INDICATIONS:
  21 weeks gestation of pregnancy
FETAL EVALUATION:
 Num Of Fetuses:          1
 Fetal Heart Rate(bpm):   157
 Presentation:            Transverse, head to maternal left
 Placenta:                Anterior, No previa
                             Largest Pocket(cm)
BIOMETRY:
 BPD:      48.7  mm     G. Age:  20w 5d         22  %    CI:        69.93   %    70 - 86
                                                         FL/HC:       18.8  %    15.9 -
 HC:      185.8  mm     G. Age:  20w 6d         20  %    HC/AC:       1.16       1.06 -
 AC:      160.2  mm     G. Age:  21w 1d         33  %    FL/BPD:      71.9  %
 FL:         35  mm     G. Age:  21w 0d         28  %    FL/AC:       21.8  %    20 - 24
 HUM:      32.5  mm     G. Age:  20w 6d         35  %
 Est. FW:     396   gm    0 lb 14 oz     30  %
OB HISTORY:
 Gravidity:    3         Term:   2
GESTATIONAL AGE:
 LMP:           18w 6d        Date:  07/08/19                 EDD:   04/13/20
 U/S Today:     21w 0d                                        EDD:   03/29/20
 Best:          21w 3d     Det. By:  U/S  (10/03/19)          EDD:   03/26/20
ANATOMY:
 Cavum:                 Within Normal Limits   Ductal Arch:            Normal appearance
 Ventricles:            Normal appearance      Stomach:                Seen
 Cerebellum:            Visualized             Abdominal Wall:         Visualized
                        previously                                     previously
 Posterior Fossa:       Visualized             Cord Vessels:           3 vessels,
                        previously                                     visualized previously
 Face:                  Orbits visualized      Kidneys:                Normal appearance
                        previously
 Lips:                  Normal appearance      Bladder:                Seen
 Heart:                 4-Chamber view         Spine:                  Appears WNL
                        appears normal
 RVOT:                  Normal appearance      Upper Extremities:      Visualized
                                                                       previously
 LVOT:                  Normal appearance      Lower Extremities:      Visualized
CERVIX UTERUS ADNEXA:
 Cervix
 Length:            4.1  cm.

[Series 1: us mfm ob follow-up · 0.23mm/px · 12 of 55 slices shown]
[im 3/55]
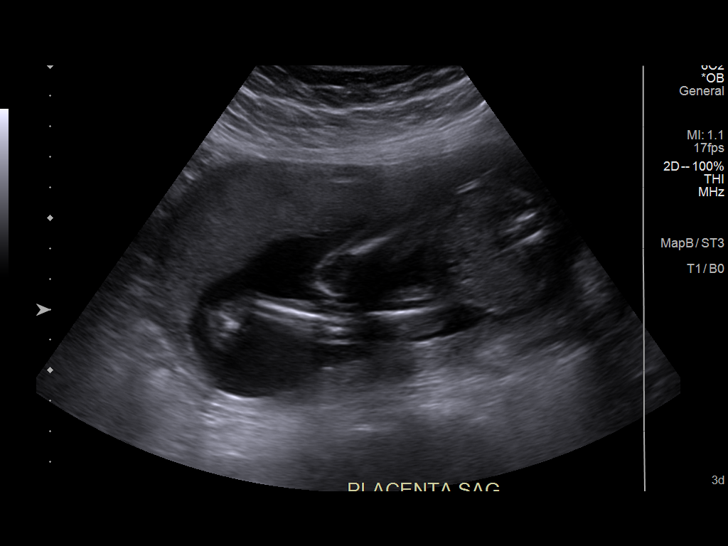
[im 7/55]
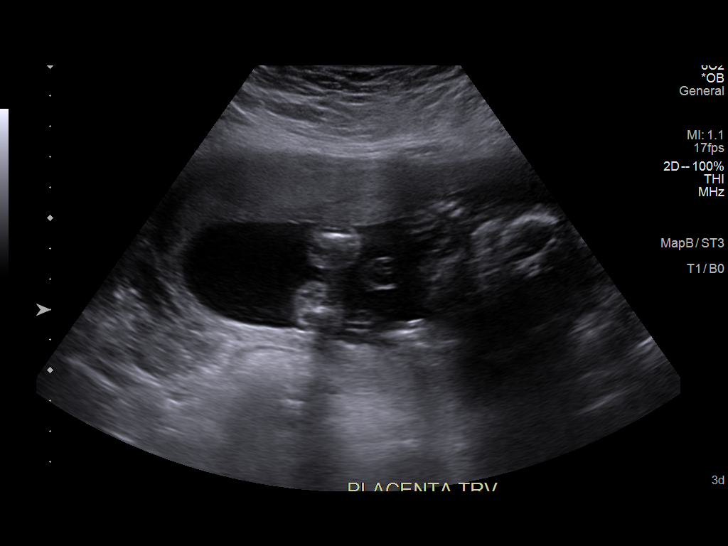
[im 11/55]
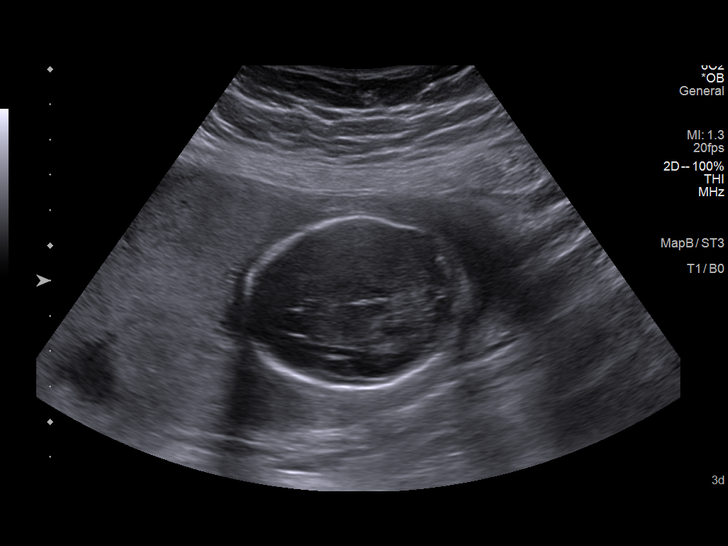
[im 17/55]
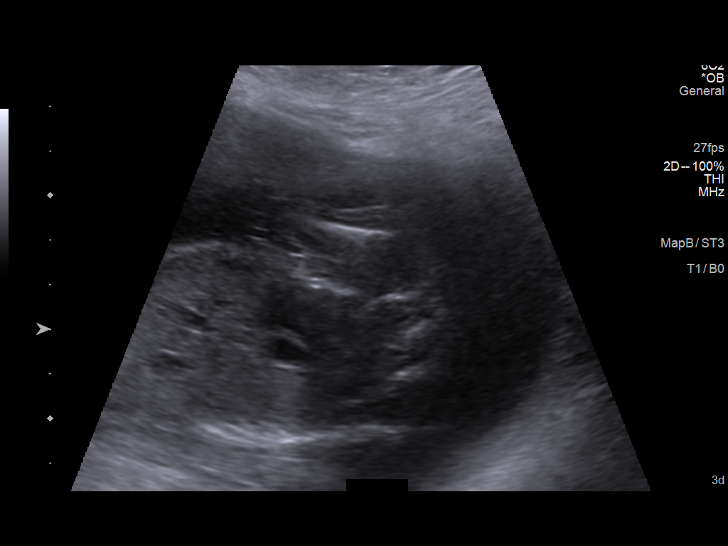
[im 21/55]
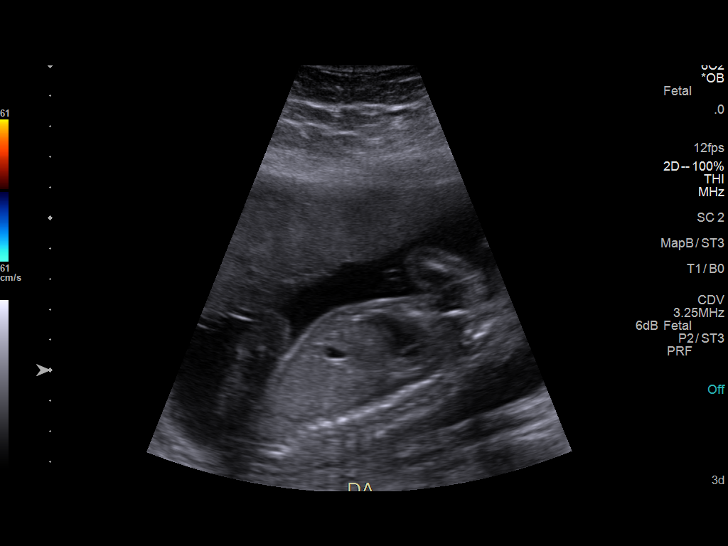
[im 25/55]
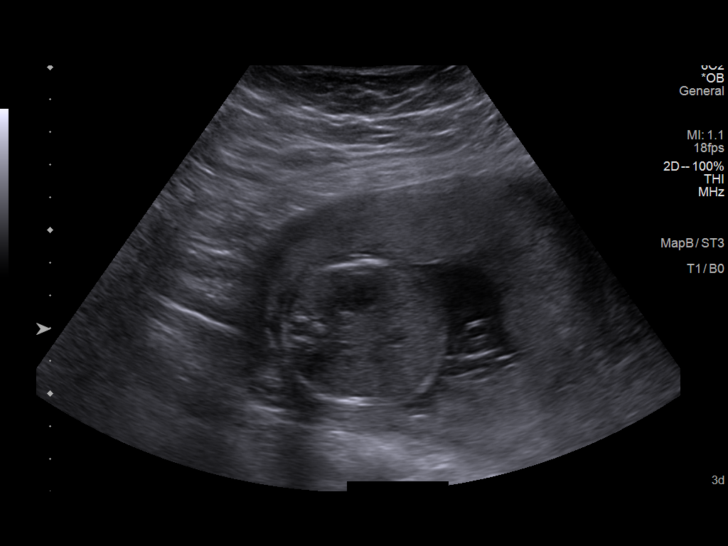
[im 31/55]
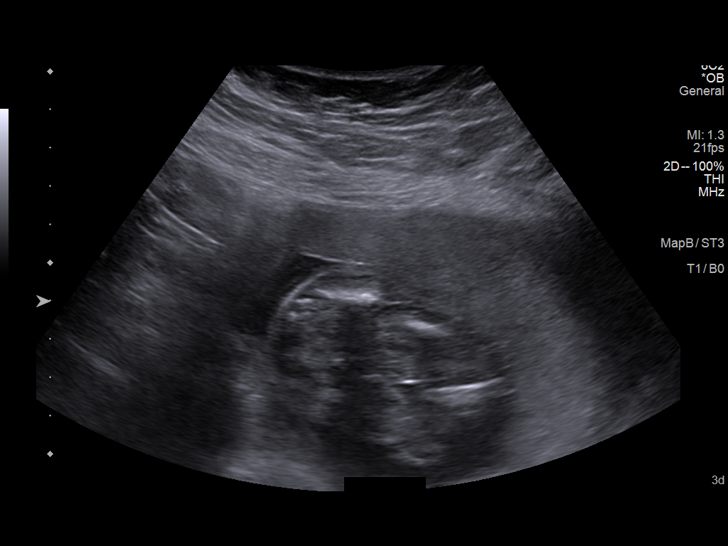
[im 35/55]
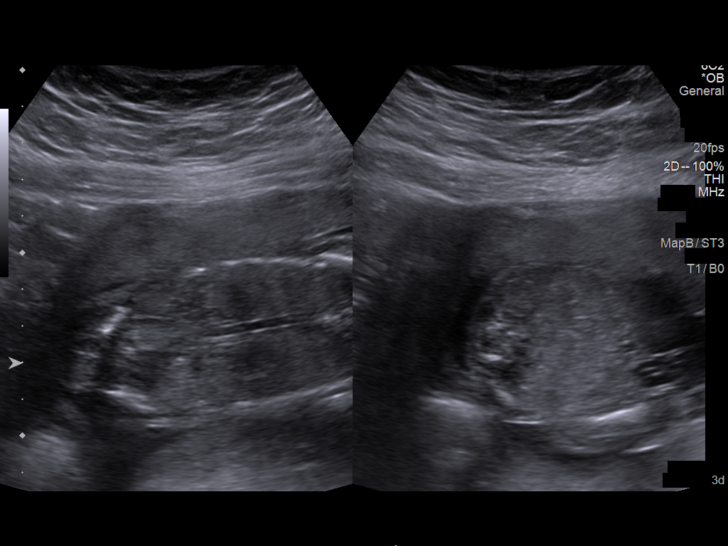
[im 39/55]
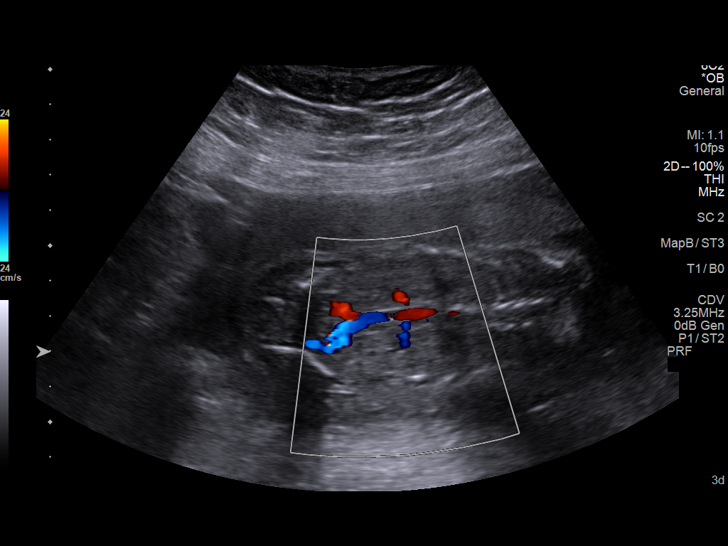
[im 45/55]
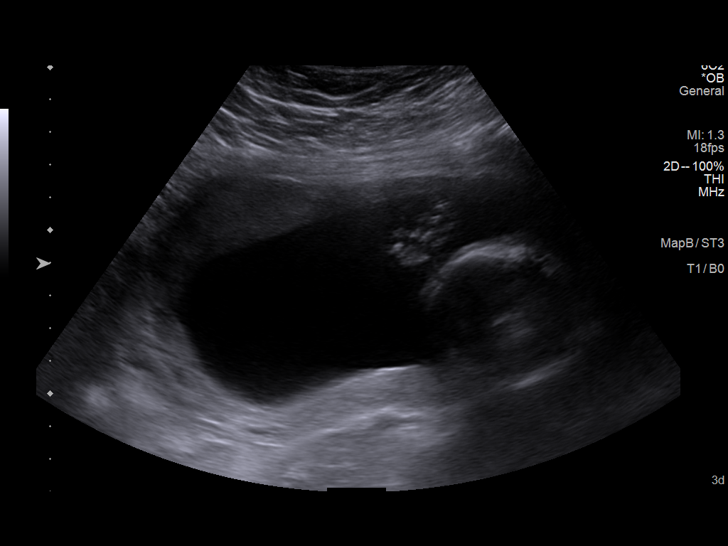
[im 49/55]
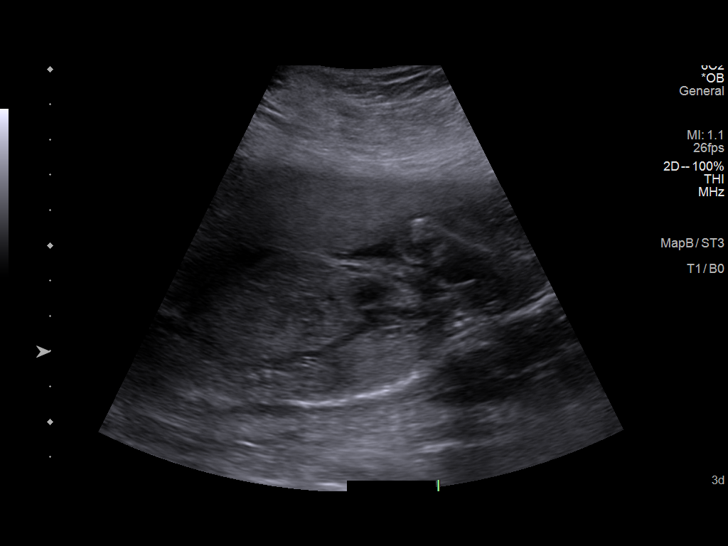
[im 53/55]
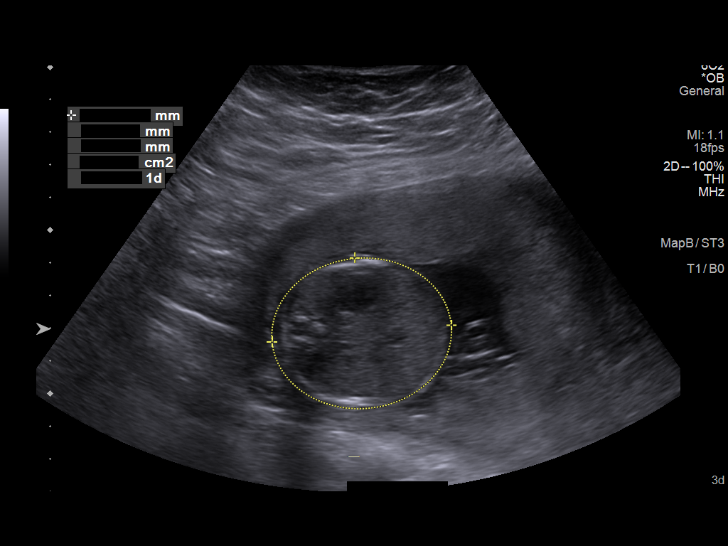

[12 of 28 positions shown; findings below may reference images not displayed]

IMPRESSION: Dear Ms ABDULBARI,

 Thank you for referring your patient to obtain views of the
 fetal anatomy that were suboptimally visualized on prior
 examination. Pregnancy is complicated by elevated BMI .
 She is currently at 21w 3d by earliest scan at 15w 0d  on
 10/03/19  at [facility].
 There has been normal interval growth .
 there is no placenta previa.

 There is a singleton gestation with subjectively normal
 amniotic fluid volume.

 The fetal structures suboptimally visualized at the prior
 examination ( profile, CSP , nose lip and ductal arch)  were
 seen today with normal appearance.

 Follow-up ultrasound is advised as clinically indicated.

 Thank you for allowing us to participate in your patient's care.
 assistance.

## 2019-11-21 ENCOUNTER — Other Ambulatory Visit: Payer: Self-pay

## 2019-11-21 ENCOUNTER — Ambulatory Visit: Payer: Medicaid Other | Admitting: Advanced Practice Midwife

## 2019-11-21 VITALS — BP 110/48 | HR 87 | Temp 97.0°F | Wt 223.4 lb

## 2019-11-21 DIAGNOSIS — O09299 Supervision of pregnancy with other poor reproductive or obstetric history, unspecified trimester: Secondary | ICD-10-CM | POA: Diagnosis not present

## 2019-11-21 DIAGNOSIS — O99211 Obesity complicating pregnancy, first trimester: Secondary | ICD-10-CM

## 2019-11-21 DIAGNOSIS — Z3480 Encounter for supervision of other normal pregnancy, unspecified trimester: Secondary | ICD-10-CM

## 2019-11-21 NOTE — Progress Notes (Signed)
   PRENATAL VISIT NOTE  Subjective:  Pamela Mack is a 24 y.o. G3P2002 at [redacted]w[redacted]d being seen today for ongoing prenatal care.  She is currently monitored for the following issues for this low-risk pregnancy and has History of 2 cesarean sections; Supervision of other normal pregnancy, antepartum; History of pre-eclampsia in prior pregnancy, currently pregnant; and Obesity affecting pregnancy in first trimester on their problem list.  Patient reports no complaints.  Contractions: Not present. Vag. Bleeding: None.  Movement: (!) Decreased. Denies leaking of fluid/ROM.   The following portions of the patient's history were reviewed and updated as appropriate: allergies, current medications, past family history, past medical history, past social history, past surgical history and problem list. Problem list updated.  Objective:   Vitals:   11/21/19 1353  BP: (!) 110/48  Pulse: 87  Temp: (!) 97 F (36.1 C)  Weight: 223 lb 6.4 oz (101.3 kg)    Fetal Status: Fetal Heart Rate (bpm): 160 Fundal Height: 23 cm Movement: (!) Decreased     General:  Alert, oriented and cooperative. Patient is in no acute distress.  Skin: Skin is warm and dry. No rash noted.   Cardiovascular: Normal heart rate noted  Respiratory: Normal respiratory effort, no problems with respiration noted  Abdomen: Soft, gravid, appropriate for gestational age.  Pain/Pressure: Absent     Pelvic: Cervical exam deferred        Extremities: Normal range of motion.  Edema: None  Mental Status: Normal mood and affect. Normal behavior. Normal judgment and thought content.   Assessment and Plan:  Pregnancy: G3P2002 at [redacted]w[redacted]d  1. Supervision of other normal pregnancy, antepartum PHQ-9=11.  Pt crying and reports cries 1-2x/wk, poor sleep, +moody, irritable, appetite wnl, -SI/HI, -anhedonia.  Agrees to meet counselor Kathreen Cosier.   Taking ASA 81` mg daily Wants repeat c/x with BTL--RN to discuss fee Pt states she doesn't feel  baby move every day--counseled to lie in quiet room if doesn't feel baby and drink cold sweet drink and go to L&D if doesn't feel movement daily  2. Obesity affecting pregnancy in first trimester 28 lb 6.4 oz (12.9 kg)   3. History of pre-eclampsia in prior pregnancy, currently pregnant BP=wnl today   Preterm labor symptoms and general obstetric precautions including but not limited to vaginal bleeding, contractions, leaking of fluid and fetal movement were reviewed in detail with the patient. Please refer to After Visit Summary for other counseling recommendations.  Return in about 4 weeks (around 12/19/2019) for routine PNC.  No future appointments.  Alberteen Spindle, CNM

## 2019-11-21 NOTE — Progress Notes (Signed)
Here today for 22.0 week MH RV. Taking PNV QD. Denies ED/hospital visits since last RV. [facility], PHQ 9 forms today. Tawny Hopping, RN

## 2019-12-19 ENCOUNTER — Ambulatory Visit: Payer: Self-pay | Admitting: Family Medicine

## 2019-12-19 ENCOUNTER — Other Ambulatory Visit: Payer: Self-pay

## 2019-12-19 ENCOUNTER — Encounter: Payer: Self-pay | Admitting: Family Medicine

## 2019-12-19 VITALS — BP 103/66 | HR 93 | Temp 98.5°F | Wt 226.4 lb

## 2019-12-19 DIAGNOSIS — O09299 Supervision of pregnancy with other poor reproductive or obstetric history, unspecified trimester: Secondary | ICD-10-CM

## 2019-12-19 DIAGNOSIS — O99210 Obesity complicating pregnancy, unspecified trimester: Secondary | ICD-10-CM

## 2019-12-19 DIAGNOSIS — Z3480 Encounter for supervision of other normal pregnancy, unspecified trimester: Secondary | ICD-10-CM

## 2019-12-19 DIAGNOSIS — F419 Anxiety disorder, unspecified: Secondary | ICD-10-CM

## 2019-12-19 DIAGNOSIS — R0602 Shortness of breath: Secondary | ICD-10-CM

## 2019-12-19 DIAGNOSIS — O99211 Obesity complicating pregnancy, first trimester: Secondary | ICD-10-CM

## 2019-12-19 LAB — HEMOGLOBIN, FINGERSTICK: Hemoglobin: 11 g/dL — ABNORMAL LOW (ref 11.1–15.9)

## 2019-12-19 NOTE — Progress Notes (Signed)
PRENATAL VISIT NOTE  Subjective:  Pamela Mack is a 24 y.o. G3P2002 at [redacted]w[redacted]d being seen today for ongoing prenatal care.  She is currently monitored for the following issues for this high-risk pregnancy and has History of 2 cesarean sections; Supervision of other normal pregnancy, antepartum; History of pre-eclampsia in prior pregnancy, currently pregnant; and Obesity affecting pregnancy in first trimester on their problem list.  Patient reports on May 19 she had vaginal pressure/pain from 2am - 3pm. She did not go to ER, pain has resolved and not returned.  States she has been very worried with this pregnancy. Mood has been up and down. Has not yet spoke with LCSW here, though would like to.   For past few weeks when she walks or talks much she gets out of breath. Occ feels dizzy, denies fainting. Denies chest pain.  Yesterday she had a painful cramp in her L calf, lasted ~3 minutes, muscle felt like a hard lump, then resolved. She continues to have some pain w/walking in this calf. Denies swelling or redness.    Contractions: Not present. Vag. Bleeding: None.  Movement: Present. Denies leaking of fluid/ROM.   The following portions of the patient's history were reviewed and updated as appropriate: allergies, current medications, past family history, past medical history, past social history, past surgical history and problem list. Problem list updated.  Objective:   Vitals:   12/19/19 1359  BP: 103/66  Pulse: 93  Temp: 98.5 F (36.9 C)  Weight: 226 lb 6.4 oz (102.7 kg)  SPO2: sitting: 100%; walking 96% Pulse: sitting 93, walking 108    Fetal Status: Fetal Heart Rate (bpm): 160 Fundal Height: 26 cm Movement: Present     General:  Alert, oriented and cooperative. Patient is in no acute distress and converses easily.  Skin: Skin is warm and dry. No rash noted.   Cardiovascular: Normal heart rate noted, regular rhythm.  Respiratory: Normal respiratory effort, no problems  with respiration noted. Lungs CTAB.  Abdomen: Soft, gravid, appropriate for gestational age.  Pain/Pressure: Absent     Pelvic: Cervical exam deferred        Extremities: Normal range of motion.  Edema: Trace LLE: 42cm diameter, mild tenderness base of calf, no erythema or swelling. RLE: 43cm diameter, nontender, no erythema or swelling.   Mental Status: Normal mood and affect. Normal behavior. Normal judgment and thought content.   Assessment and Plan:  Pregnancy: G3P2002 at [redacted]w[redacted]d    1. Supervision of other normal pregnancy, antepartum -Up to date.   2. SOB (shortness of breath) on exertion -Discussed with Dr. Alvester Morin. SOB with physical exertion or talking a lot is likely physiologic d/t pregnancy. Differential also includes PE, cardiac causes or anemia, though heart/lung exams wnl, pt has no excessive edema, vitals (including sitting/walking Spo2) are wnl and Hgb is 11.0 today. She does have mild tenderness of L calf, but this is s/p intense muscle cramp yesterday, unlikely d/t DVT. -Though this is likely of benign origin we also discussed at length to go to ER if she has difficulty breathing, any chest pain or if these conditions worsen in any way. She states understanding. - Hemoglobin, venipuncture  3. Anxiety -Pt endorses anxiety, would like to speak with LCSW. Referral placed today. - Ambulatory referral to Behavioral Health  4. History of pre-eclampsia in prior pregnancy, currently pregnant -Taking aspiring. BP wnl, no s/sx pre-eclampsia.  5. Obesity affecting pregnancy in first trimester 31 lb 6.4 oz (14.2 kg) Pt states walking/exercise  is difficult d/t chronic back pain.    Preterm labor symptoms and general obstetric precautions including but not limited to vaginal bleeding, contractions, leaking of fluid and fetal movement were reviewed in detail with the patient. Please refer to After Visit Summary for other counseling recommendations.  Return in about 2 weeks (around  01/02/2020) for routine prenatal care.  Future Appointments  Date Time Provider [place]  01/02/2020  2:00 PM [facility] PROVIDER [facility] None    Kandee Keen, PA-C

## 2019-12-19 NOTE — Progress Notes (Signed)
Patient here for MH RV at 26 weeks. Patient c/o having low back pain that came and went and felt a lot of pressure in her abdomen and vagina. This occurred for about 6 hours last Wednesday. She states she didn't call because she wasn't bleeding. Used pillows for back support. Also states she had pain in the middle of the night.Burt Knack, RN

## 2019-12-29 NOTE — Progress Notes (Signed)
Attestation of Attending Supervision of Advanced Practitioner: Evaluation and management procedures were performed by the Advanced Practioner with my collaboration.  I have reviewed the PAs note and chart, and I agree with the management and plan. I was consulted and the documentation reflects our discussion.   Oreoluwa Gilmer Niles Taje Littler, MD, MPH, ABFM Medical Director [facility]  

## 2020-01-02 ENCOUNTER — Ambulatory Visit: Payer: Self-pay | Admitting: Advanced Practice Midwife

## 2020-01-02 ENCOUNTER — Other Ambulatory Visit: Payer: Self-pay

## 2020-01-02 ENCOUNTER — Telehealth: Payer: Self-pay | Admitting: Family Medicine

## 2020-01-02 VITALS — BP 116/67 | HR 106 | Temp 97.3°F | Wt 227.8 lb

## 2020-01-02 DIAGNOSIS — Z3480 Encounter for supervision of other normal pregnancy, unspecified trimester: Secondary | ICD-10-CM

## 2020-01-02 DIAGNOSIS — Z23 Encounter for immunization: Secondary | ICD-10-CM

## 2020-01-02 DIAGNOSIS — O09299 Supervision of pregnancy with other poor reproductive or obstetric history, unspecified trimester: Secondary | ICD-10-CM

## 2020-01-02 DIAGNOSIS — O99211 Obesity complicating pregnancy, first trimester: Secondary | ICD-10-CM

## 2020-01-02 LAB — OB RESULTS CONSOLE HIV ANTIBODY (ROUTINE TESTING): HIV: NONREACTIVE

## 2020-01-02 LAB — HEMOGLOBIN, FINGERSTICK: Hemoglobin: 11.2 g/dL (ref 11.1–15.9)

## 2020-01-02 NOTE — Progress Notes (Signed)
In for visit; taking PNV & 81 mg ASA; denies hospital visits since last appt; 28 wk labs today; agrees to Tdap Sharlette Dense, RN

## 2020-01-02 NOTE — Progress Notes (Signed)
   PRENATAL VISIT NOTE  Subjective:  Pamela Mack is a 24 y.o. G3P2002 at [redacted]w[redacted]d being seen today for ongoing prenatal care.  She is currently monitored for the following issues for this low-risk pregnancy and has History of 2 cesarean sections; Supervision of other normal pregnancy, antepartum; History of pre-eclampsia in prior pregnancy, currently pregnant; and Obesity affecting pregnancy in first trimester on their problem list.  Patient reports no complaints.  Contractions: Not present. Vag. Bleeding: None.  Movement: Present. Denies leaking of fluid/ROM.   The following portions of the patient's history were reviewed and updated as appropriate: allergies, current medications, past family history, past medical history, past social history, past surgical history and problem list. Problem list updated.  Objective:   Vitals:   01/02/20 1351  BP: 116/67  Pulse: (!) 106  Temp: (!) 97.3 F (36.3 C)  Weight: 227 lb 12.8 oz (103.3 kg)    Fetal Status: Fetal Heart Rate (bpm): 150 Fundal Height: 28 cm Movement: Present     General:  Alert, oriented and cooperative. Patient is in no acute distress.  Skin: Skin is warm and dry. No rash noted.   Cardiovascular: Normal heart rate noted  Respiratory: Normal respiratory effort, no problems with respiration noted  Abdomen: Soft, gravid, appropriate for gestational age.  Pain/Pressure: Absent     Pelvic: Cervical exam deferred        Extremities: Normal range of motion.  Edema: None  Mental Status: Normal mood and affect. Normal behavior. Normal judgment and thought content.   Assessment and Plan:  Pregnancy: G3P2002 at [redacted]w[redacted]d  1. Supervision of other normal pregnancy, antepartum Feels tired; to check Hgb today.  Swims 3-4x/wk.   Not working - Glucose, 1 hour gestational - RPR - [facility] - Hemoglobin, venipuncture  2. History of pre-eclampsia in prior pregnancy, currently pregnant BP=wnl.    3. Obesity affecting pregnancy in  first trimester 32 lb 12.8 oz (14.9 kg) Taking ASA 81 mg daily   Preterm labor symptoms and general obstetric precautions including but not limited to vaginal bleeding, contractions, leaking of fluid and fetal movement were reviewed in detail with the patient. Please refer to After Visit Summary for other counseling recommendations.  Return in about 2 weeks (around 01/16/2020) for routine PNC.  Future Appointments  Date Time Provider Department Center  01/12/2020  1:00 PM Kathreen Cosier, LCSW [facility] None    Alberteen Spindle, CNM

## 2020-01-02 NOTE — Telephone Encounter (Signed)
Patient states she wants to get her tubes tied when she delivers and wants to make sure provider is aware of thst and make sure she can actually have btl

## 2020-01-02 NOTE — Telephone Encounter (Signed)
Returned call via interpreter V. Olmedo-discussed if no Medicaid or Insurance will need to discuss with [facility] at delivery of desire for BTL.  Reminded will be billed also by [facility] and anesthesiology.  Offered to have a back-up plan such as LARCs, if no BTL done.  To call with further ?'s Sharlette Dense, RN

## 2020-01-03 LAB — RPR: RPR Ser Ql: NONREACTIVE

## 2020-01-03 LAB — GLUCOSE, 1 HOUR GESTATIONAL: Gestational Diabetes Screen: 127 mg/dL (ref 65–139)

## 2020-01-12 ENCOUNTER — Ambulatory Visit: Payer: Self-pay | Admitting: Licensed Clinical Social Worker

## 2020-01-12 NOTE — Progress Notes (Unsigned)
Counselor Initial Adult Exam  Name: Pamela Mack Date: 01/12/2020 MRN: [id number] DOB: 20-Jul-1996 PCP: Patient, No Pcp Per  Time spent: ***  A biopsychosocial was completed on the Patient. Background information and current concerns were obtained during an intake in the office with the [facility] clinician, Leanna Battles, LCSW.  Contact information and confidentiality was discussed and appropriate consents were signed.     Reason for Visit /Presenting Problem: ***  Mental Status Exam:   Appearance:   {PSY:22683}     Behavior:  {PSY:21022743}  Motor:  {PSY:22302}  Speech/Language:   {PSY:22685}  Affect:  {PSY:22687}  Mood:  {PSY:31886}  Thought process:  {PSY:31888}  Thought content:    {[phone]}  Sensory/Perceptual disturbances:    {PSY:9470831437}  Orientation:  {PSY:30297}  Attention:  {PSY:22877}  Concentration:  {[phone]}  Memory:  {PSY:[phone]}  Fund of knowledge:   {[phone]}  Insight:    {[phone]}  Judgment:   {[phone]}  Impulse Control:  {[phone]}   Reported Symptoms:  {PSY:[phone]}  Risk Assessment: Danger to Self:  {PSY:22692} Self-injurious Behavior: {PSY:22692} Danger to Others: {PSY:22692} Duty to Warn:{PSY:311194} Physical Aggression / Violence:{PSY:21197} Access to Firearms a concern: {PSY:21197} Gang Involvement:{PSY:21197} Patient / guardian was educated about steps to take if suicide or homicide risk level increases between visits: {Yes/No-Ex:120004} While future psychiatric events cannot be accurately predicted, the patient does not currently require acute inpatient psychiatric care and does not currently meet [facility] involuntary commitment criteria.  Substance Abuse History: Current substance abuse: {PSY:21197}    Past Psychiatric History:   {Past psych history:20559} Outpatient Providers:*** History of Psych Hospitalization: {PSY:21197} Psychological Testing:  {PSY:21014032}   Abuse History: Victim of {Abuse History:314532}, {Type of abuse:20566}   Report needed: {PSY:314532} Victim of Neglect:{yes no:314532} Perpetrator of {PSY:20566}  Witness / Exposure to Domestic Violence: {PSY:21197}  Protective Services Involvement: {PSY:21197} Witness to [employer] Violence:  {PSY:21197}  Family History:  Family History  Problem Relation Age of Onset  . Heart murmur Sister   . Diabetes Maternal Grandfather     Social History:  Social History   Socioeconomic History  . Marital status: Single    Spouse name: Not on file  . Number of children: Not on file  . Years of education: 59  . Highest education level: Not on file  Occupational History  . Not on file  Tobacco Use  . Smoking status: Never Smoker  . Smokeless tobacco: Never Used  Vaping Use  . Vaping Use: Never used  Substance and Sexual Activity  . Alcohol use: Not Currently    Alcohol/week: 2.0 standard drinks    Types: 2 Cans of beer per week    Comment: Last ETOH use 3 weeks ago.  . Drug use: Never  . Sexual activity: Not Currently  Other Topics Concern  . Not on file  Social History Narrative  . Not on file   Social Determinants of Health   Financial Resource Strain: Medium Risk  . Difficulty of Paying Living Expenses: Somewhat hard  Food Insecurity: No Food Insecurity  . Worried About Programme researcher, broadcasting/film/video in the Last Year: Never true  . Ran Out of Food in the Last Year: Never true  Transportation Needs: No Transportation Needs  . Lack of Transportation (Medical): No  . Lack of Transportation (Non-Medical): No  Physical Activity:   . Days of Exercise per Week:   . Minutes of Exercise per Session:   Stress:   . Feeling of Stress :   Social  Connections:   . Frequency of Communication with Friends and Family:   . Frequency of Social Gatherings with Friends and Family:   . Attends Religious Services:   . Active Member of Clubs or Organizations:   . Attends Tax inspector Meetings:   Marland Kitchen Marital Status:     Living situation: the patient {lives:315711::"lives with their family"}  Sexual Orientation:  {Sexual Orientation:[phone]}  Relationship Status: {Desc; marital status:62}  Name of spouse / other:***             If a parent, number of children / ages:***  Support Systems; {DIABETES SUPPORT:20310}  Financial Stress:  {YES/NO:21197}  Income/Employment/Disability: Manufacturing engineer: [employer]  Educational History: Education: {PSY :31912}  Religion/Sprituality/World View:   {CHL AMB RELIGION/SPIRITUALITY:[phone]}  Any cultural differences that may affect / interfere with treatment:  {Religious/Cultural:200019}  Recreation/Hobbies: {Woc hobbies:30428}  Stressors:{PATIENT STRESSORS:22669}  Strengths:  {Patient Coping Strengths:[phone]}  Barriers:  ***   Legal History: Pending legal issue / charges: {PSY:20588} History of legal issue / charges: {Legal Issues:[phone]}  Medical History/Surgical History:{Desc; reviewed/not reviewed:60074} Past Medical History:  Diagnosis Date  . History of pre-eclampsia   . Pre-eclampsia affecting childbirth   . Pregnancy induced hypertension     Past Surgical History:  Procedure Laterality Date  . CESAREAN SECTION  01/17/2013   [place]  . CESAREAN SECTION  07/31/2016   scheduled    Medications: Current Outpatient Medications  Medication Sig Dispense Refill  . aspirin EC 81 MG tablet Take 81 mg by mouth daily.    . Prenatal Vit-Fe Fumarate-FA (PRENATAL VITAMIN) 27-0.8 MG TABS Take 1 tablet by mouth daily at 6 (six) AM. 100 tablet 0   No current facility-administered medications for this visit.    Allergies  Allergen Reactions  . Other [facility] Armband's-was witnessed by RN in [facility]-Hives around and under armband after placement.   Jacquline Merlos [name] is a 24 y.o. year old female  with a reported history of  diagnoses of. Patient currently presents with **** that she reports she has experienced for a *** time. Patient currently describes both depressive symptoms and anxiety symptoms. She reports significant *** symptoms, including ***. Although patient endorses these vague suicidal ideations, she denies any current plan, intent, or means to harm herself. She also describes ***. Patient reports that these symptoms significantly impact her functioning in multiple life domains.   Due to the above symptoms and patient's reported history, patient is diagnosed with Major Depressive Disorder, recurrent episode, Moderate and Generalized Anxiety Disorder, With panic attacks. Patient's mood symptoms should continue to be monitored closely to provide further diagnosis clarification. Continued mental health treatment is needed to address patient's symptoms and monitor her safety and stability. Patient is recommended for psychiatric medication management evaluation and continued outpatient therapy to further reduce her symptoms and improve her coping strategies.    There is no acute risk for suicide or violence at this time.  While future psychiatric events cannot be accurately predicted, the patient does not require acute inpatient psychiatric care and does not currently meet [facility] involuntary commitment criteria.  Diagnoses:  No diagnosis found.  Plan of Care: Patient's goal of treatment is:  LCSW provided psychoeducation on CBTs.  LCSW and patient agreed to develop treatment plan at next session.    Future Appointments  Date Time Provider Department Center  01/12/2020  1:00 PM Kathreen Cosier, LCSW [facility] None  01/16/2020  2:00 PM [facility] PROVIDER [facility]  None   Interpreter used: {[facility] Interpreters:210350009}  Milton Ferguson, LCSW

## 2020-01-16 ENCOUNTER — Ambulatory Visit: Payer: Self-pay

## 2020-01-18 ENCOUNTER — Ambulatory Visit: Payer: Self-pay

## 2020-01-18 ENCOUNTER — Ambulatory Visit: Payer: Self-pay | Admitting: Advanced Practice Midwife

## 2020-01-18 ENCOUNTER — Other Ambulatory Visit: Payer: Self-pay

## 2020-01-18 VITALS — BP 101/62 | HR 117 | Temp 97.5°F | Wt 231.0 lb

## 2020-01-18 DIAGNOSIS — O09299 Supervision of pregnancy with other poor reproductive or obstetric history, unspecified trimester: Secondary | ICD-10-CM

## 2020-01-18 DIAGNOSIS — Z3480 Encounter for supervision of other normal pregnancy, unspecified trimester: Secondary | ICD-10-CM

## 2020-01-18 DIAGNOSIS — Z98891 History of uterine scar from previous surgery: Secondary | ICD-10-CM

## 2020-01-18 DIAGNOSIS — O99211 Obesity complicating pregnancy, first trimester: Secondary | ICD-10-CM

## 2020-01-18 NOTE — Progress Notes (Signed)
Patient here for MH RV at 30 2/7. Here with young daughter. [facility] financial counseler numbers given and BTL pamphlet given. Patient counseled to review pamphlet and ask questions at next visit if desires.Burt Knack, RN

## 2020-01-18 NOTE — Progress Notes (Signed)
   PRENATAL VISIT NOTE  Subjective:  Pamela Mack is a 24 y.o. G3P2002 at [redacted]w[redacted]d being seen today for ongoing prenatal care.  She is currently monitored for the following issues for this low-risk pregnancy and has History of 2 cesarean sections; Supervision of other normal pregnancy, antepartum; History of pre-eclampsia in prior pregnancy, currently pregnant; and Obesity affecting pregnancy in first trimester on their problem list.  Patient reports no complaints.  Contractions: Not present. Vag. Bleeding: None.  Movement: Present. Denies leaking of fluid/ROM.   The following portions of the patient's history were reviewed and updated as appropriate: allergies, current medications, past family history, past medical history, past social history, past surgical history and problem list. Problem list updated.  Objective:   Vitals:   01/18/20 1347  BP: 101/62  Pulse: (!) 117  Temp: (!) 97.5 F (36.4 C)  Weight: 231 lb (104.8 kg)    Fetal Status: Fetal Heart Rate (bpm): 140 Fundal Height: 30 cm Movement: Present     General:  Alert, oriented and cooperative. Patient is in no acute distress.  Skin: Skin is warm and dry. No rash noted.   Cardiovascular: Normal heart rate noted  Respiratory: Normal respiratory effort, no problems with respiration noted  Abdomen: Soft, gravid, appropriate for gestational age.  Pain/Pressure: Absent     Pelvic: Cervical exam deferred        Extremities: Normal range of motion.  Edema: None  Mental Status: Normal mood and affect. Normal behavior. Normal judgment and thought content.   Assessment and Plan:  Pregnancy: G3P2002 at [redacted]w[redacted]d  1. Supervision of other normal pregnancy, antepartum Feels well.  Swimming and dancing with her children for exercise.  Not working  2. Obesity affecting pregnancy in first trimester 36 lb (16.3 kg) Taking ASA 81 mg daily  3. History of pre-eclampsia in prior pregnancy, currently pregnant BP wnl today  4. History of  2 cesarean sections To schedule [facility] delivery plans appt at 36 wks for repeat c/s with BTL   Preterm labor symptoms and general obstetric precautions including but not limited to vaginal bleeding, contractions, leaking of fluid and fetal movement were reviewed in detail with the patient. Please refer to After Visit Summary for other counseling recommendations.  No follow-ups on file.  Future Appointments  Date Time Provider Department Center  02/02/2020  3:00 PM Kathreen Cosier, LCSW [facility] None    Alberteen Spindle, CNM

## 2020-01-30 ENCOUNTER — Other Ambulatory Visit: Payer: Self-pay

## 2020-01-30 DIAGNOSIS — R519 Headache, unspecified: Secondary | ICD-10-CM | POA: Insufficient documentation

## 2020-01-30 DIAGNOSIS — R1030 Lower abdominal pain, unspecified: Secondary | ICD-10-CM | POA: Insufficient documentation

## 2020-01-30 DIAGNOSIS — H9202 Otalgia, left ear: Secondary | ICD-10-CM | POA: Insufficient documentation

## 2020-01-30 DIAGNOSIS — Z5321 Procedure and treatment not carried out due to patient leaving prior to being seen by health care provider: Secondary | ICD-10-CM | POA: Insufficient documentation

## 2020-01-30 DIAGNOSIS — K0889 Other specified disorders of teeth and supporting structures: Secondary | ICD-10-CM | POA: Insufficient documentation

## 2020-01-30 LAB — COMPREHENSIVE METABOLIC PANEL
ALT: 11 U/L (ref 0–44)
AST: 12 U/L — ABNORMAL LOW (ref 15–41)
Albumin: 3 g/dL — ABNORMAL LOW (ref 3.5–5.0)
Alkaline Phosphatase: 67 U/L (ref 38–126)
Anion gap: 12 (ref 5–15)
BUN: 8 mg/dL (ref 6–20)
CO2: 18 mmol/L — ABNORMAL LOW (ref 22–32)
Calcium: 9.2 mg/dL (ref 8.9–10.3)
Chloride: 105 mmol/L (ref 98–111)
Creatinine, Ser: 0.5 mg/dL (ref 0.44–1.00)
GFR calc Af Amer: >60 mL/min (ref >60)
GFR calc non Af Amer: >60 mL/min (ref >60)
Glucose, Bld: 101 mg/dL — ABNORMAL HIGH (ref 70–99)
Potassium: 3.4 mmol/L — ABNORMAL LOW (ref 3.5–5.1)
Sodium: 135 mmol/L (ref 135–145)
Total Bilirubin: 0.6 mg/dL (ref 0.3–1.2)
Total Protein: 6.8 g/dL (ref 6.5–8.1)

## 2020-01-30 LAB — URINALYSIS, COMPLETE (UACMP) WITH MICROSCOPIC
Bilirubin Urine: NEGATIVE
Glucose, UA: NEGATIVE mg/dL
Hgb urine dipstick: NEGATIVE
Ketones, ur: NEGATIVE mg/dL
Nitrite: POSITIVE — AB
Protein, ur: NEGATIVE mg/dL
Specific Gravity, Urine: 1.01 (ref 1.005–1.030)
pH: 7 (ref 5.0–8.0)

## 2020-01-30 LAB — CBC
HCT: 28.4 % — ABNORMAL LOW (ref 36.0–46.0)
Hemoglobin: 10.4 g/dL — ABNORMAL LOW (ref 12.0–15.0)
MCH: 30.5 pg (ref 26.0–34.0)
MCHC: 36.6 g/dL — ABNORMAL HIGH (ref 30.0–36.0)
MCV: 83.3 fL (ref 80.0–100.0)
Platelets: 236 10*3/uL (ref 150–400)
RBC: 3.41 MIL/uL — ABNORMAL LOW (ref 3.87–5.11)
RDW: 12.3 % (ref 11.5–15.5)
WBC: 11 10*3/uL — ABNORMAL HIGH (ref 4.0–10.5)
nRBC: 0 % (ref 0.0–0.2)

## 2020-01-30 LAB — LIPASE, BLOOD: Lipase: 34 U/L (ref 11–51)

## 2020-01-30 NOTE — ED Triage Notes (Signed)
Pt comes from home via POV due to left ear pain, pain in the left molar pain and headache. Pt states that 2 days ago she began to experience these symptoms and at times her headache causes her not to be able to sleep. Pt also complains of lower abdominal pain and low back pain for the last 2 days and reports that this pain has come and gone over the last several years, at her clinic she was told if the pain was bad to come to ED. Pt reports that the pain is from C-Sections that have occurred with he last being 3 years ago.   Pt is tearful talking to staff and interpretor is being used for communication.   After assessing pt, pt also states that lately she has been experiencing "some" shortness of breath.

## 2020-01-30 NOTE — ED Notes (Signed)
Pt reports at this time of triage questions that she is 8 months pregnant.

## 2020-01-31 ENCOUNTER — Emergency Department
Admission: EM | Admit: 2020-01-31 | Discharge: 2020-01-31 | Disposition: A | Discharge status: Home / Self Care | Payer: Self-pay | Attending: Emergency Medicine | Admitting: Emergency Medicine

## 2020-01-31 NOTE — ED Notes (Signed)
Patient called, no answer; not seen in lobby, bathroom, or outside.  

## 2020-02-01 ENCOUNTER — Telehealth: Payer: Self-pay | Admitting: Family Medicine

## 2020-02-01 NOTE — Telephone Encounter (Signed)
Returned patient phone call to [phone] with [phone] language line assistance. Patient states she had went to [facility] ED yesterday for headache, ear pressure and lower back pain that she is currently rating an 8 out of 10 pain scale but did not stay for evaluation. Patient states same symptoms started about a week ago but is now in her lower back. Denies fever, contractions, vaginal discharge that is watery bloody or mucousy. Reports + fetal movement, and clear sinus drainage. States has not taken Tylenol for pain and states she is not drinking "much fluids." RN counseled to increase fluids, may take Tylenol for headache and ear pain. Patient requesting to come to clinic for appt today. States she does not want to wait until scheduled RV appt for 02/06/20. Patient scheduled OB prob appt for today at 3:20. Instructed to arrive at 3:00 for check in. Tawny Hopping, RN

## 2020-02-01 NOTE — Telephone Encounter (Signed)
Please call pt back, she is experiencing headache and  an ear ache, also feels pressure in lower abdomen.

## 2020-02-02 ENCOUNTER — Ambulatory Visit: Payer: Self-pay | Admitting: Licensed Clinical Social Worker

## 2020-02-06 ENCOUNTER — Ambulatory Visit: Payer: Self-pay

## 2020-02-09 ENCOUNTER — Encounter: Payer: Self-pay | Admitting: Physician Assistant

## 2020-02-09 ENCOUNTER — Ambulatory Visit: Payer: Self-pay | Admitting: Physician Assistant

## 2020-02-09 ENCOUNTER — Other Ambulatory Visit: Payer: Self-pay

## 2020-02-09 VITALS — BP 98/61 | HR 103 | Temp 97.4°F | Wt 231.4 lb

## 2020-02-09 DIAGNOSIS — O99013 Anemia complicating pregnancy, third trimester: Secondary | ICD-10-CM

## 2020-02-09 DIAGNOSIS — Z3480 Encounter for supervision of other normal pregnancy, unspecified trimester: Secondary | ICD-10-CM

## 2020-02-09 DIAGNOSIS — O99211 Obesity complicating pregnancy, first trimester: Secondary | ICD-10-CM

## 2020-02-09 DIAGNOSIS — Z98891 History of uterine scar from previous surgery: Secondary | ICD-10-CM

## 2020-02-09 DIAGNOSIS — O09299 Supervision of pregnancy with other poor reproductive or obstetric history, unspecified trimester: Secondary | ICD-10-CM

## 2020-02-09 HISTORY — DX: Anemia complicating pregnancy, third trimester: O99.013

## 2020-02-09 MED ORDER — FERROUS SULFATE 324 (65 FE) MG PO TBEC: 1.0000 | DELAYED_RELEASE_TABLET | Freq: Every day | ORAL | 0 refills | Status: DC

## 2020-02-09 MED ORDER — MULTI-VITAMIN/MINERALS PO TABS: 1.0000 | ORAL_TABLET | Freq: Every day | ORAL | 0 refills | Status: DC

## 2020-02-09 NOTE — Progress Notes (Signed)
Iron initiated per standing order. Sharlyne Pacas, RN

## 2020-02-09 NOTE — Progress Notes (Signed)
Here today for 33.3 MH RV. Clarified that patient wants to deliver baby at [facility] with the doctors from [facility]. Patient needs a delivery plans appt with [facility] ~ 36 weeks of pregnancy. Taking PNV and Iron QD. Went to [facility] ED 01/31/2020 for ear and headache pain. Left without being seen. Was scheduled OB problem appt here 02/01/2020 for above complaints but did not keep appt. Denies further ED/hospital visits since that time. Tawny Hopping, RN

## 2020-02-09 NOTE — Progress Notes (Signed)
MVI cancelled from record and verified with help desk. Client left without anemia profile being drawn. Call to client and able to return for blood work as only lives short distance from agency. Jossie Ng, RN

## 2020-02-09 NOTE — Progress Notes (Signed)
MVI order entered in error. Will call helpdesk to resolve. Sharlyne Pacas, RN

## 2020-02-09 NOTE — Progress Notes (Signed)
   PRENATAL VISIT NOTE  Subjective:  Pamela Mack is a 24 y.o. G3P2002 at [redacted]w[redacted]d being seen today for ongoing prenatal care.  She is currently monitored for the following issues for this high-risk pregnancy and has History of 2 cesarean sections; Supervision of other normal pregnancy, antepartum; History of pre-eclampsia in prior pregnancy, currently pregnant; Obesity affecting pregnancy in first trimester; and Anemia affecting pregnancy in third trimester on their problem list.  Patient reports persistent bilat ear stuffiness/decreased hearing with occ mild HA. No F/sore throat/cough/loss of smell. Some decreased appetite. Also some occ low back/pelvic pain. She has been using Debrox in case cerumen was causing her decreased hearing. Contractions: Not present. Vag. Bleeding: None.  Movement: Present. Denies leaking of fluid/ROM.   The following portions of the patient's history were reviewed and updated as appropriate: allergies, current medications, past family history, past medical history, past social history, past surgical history and problem list. Problem list updated.  Objective:   Vitals:   02/09/20 1015  BP: 98/61  Pulse: (!) 103  Temp: (!) 97.4 F (36.3 C)  Weight: 231 lb 6.4 oz (105 kg)    Fetal Status: Fetal Heart Rate (bpm): 136 Fundal Height: 35 cm Movement: Present     General:  Alert, oriented and cooperative. Patient is in no acute distress.  Skin: Skin is warm and dry. No rash noted.   Cardiovascular: Normal heart rate noted  Respiratory: Normal respiratory effort, no problems with respiration noted  Abdomen: Soft, gravid, appropriate for gestational age.  Pain/Pressure: Absent     Pelvic: Cervical exam deferred        Extremities: Normal range of motion.  Edema: None  Mental Status: Normal mood and affect. Normal behavior. Normal judgment and thought content.  Ear exam shows clear EACs, TMs reddened, but middle ear structures visible. Assessment and Plan:   Pregnancy: G3P2002 at [redacted]w[redacted]d  1. Supervision of other normal pregnancy, antepartum Suspect ear sx due to eustachian tube dysfunction. Rec trial of nasal saline to reduce possible inflammation/congestion at distal end of tubes. Alert [place] if fever or worsening ear sx.  2. History of 2 cesarean sections Referred for 36-wk delivery plans appt at [facility].  3. History of pre-eclampsia in prior pregnancy, currently pregnant BP normal, no edema. Continue to monitor.  4. Obesity affecting pregnancy in first trimester Excess weight gain noted - will monitor in light of decreased appetite. Consider fetal growth [place].  5. Anemia affecting pregnancy in third trimester Hgb 10.4 01/30/20. Draw anemia panel, and start oral iron in addition to prenatal vitamin. - ferrous sulfate 324 (65 Fe) MG TBEC; Take 1 tablet (325 mg total) by mouth daily.  Dispense: 100 tablet; Refill: 0 - Fe+CBC/D/Plt+TIBC+Fer+Retic   Preterm labor symptoms and general obstetric precautions including but not limited to vaginal bleeding, contractions, leaking of fluid and fetal movement were reviewed in detail with the patient. Please refer to After Visit Summary for other counseling recommendations.  Return in about 2 weeks (around 02/23/2020) for Routine prenatal care.  Future Appointments  Date Time Provider Department Center  02/23/2020  2:00 PM [facility] PROVIDER [facility] None    Landry Dyke, PA-C

## 2020-02-10 LAB — FE+CBC/D/PLT+TIBC+FER+RETIC
Basophils Absolute: 0 10*3/uL (ref 0.0–0.2)
Basos: 0 %
EOS (ABSOLUTE): 0.3 10*3/uL (ref 0.0–0.4)
Eos: 2 %
Ferritin: 50 ng/mL (ref 15–150)
Hematocrit: 33.5 % — ABNORMAL LOW (ref 34.0–46.6)
Hemoglobin: 11.4 g/dL (ref 11.1–15.9)
Immature Grans (Abs): 0.1 10*3/uL (ref 0.0–0.1)
Immature Granulocytes: 1 %
Iron Saturation: 13 % — ABNORMAL LOW (ref 15–55)
Iron: 60 ug/dL (ref 27–159)
Lymphocytes Absolute: 2.1 10*3/uL (ref 0.7–3.1)
Lymphs: 18 %
MCH: 29.9 pg (ref 26.6–33.0)
MCHC: 34 g/dL (ref 31.5–35.7)
MCV: 88 fL (ref 79–97)
Monocytes Absolute: 0.6 10*3/uL (ref 0.1–0.9)
Monocytes: 5 %
Neutrophils Absolute: 8.4 10*3/uL — ABNORMAL HIGH (ref 1.4–7.0)
Neutrophils: 74 %
Platelets: 269 10*3/uL (ref 150–450)
RBC: 3.81 x10E6/uL (ref 3.77–5.28)
RDW: 12.7 % (ref 11.7–15.4)
Retic Ct Pct: 3 % — ABNORMAL HIGH (ref 0.6–2.6)
Total Iron Binding Capacity: 452 ug/dL — ABNORMAL HIGH (ref 250–450)
UIBC: 392 ug/dL (ref 131–425)
WBC: 11.4 10*3/uL — ABNORMAL HIGH (ref 3.4–10.8)

## 2020-02-23 ENCOUNTER — Encounter: Payer: Self-pay | Admitting: Physician Assistant

## 2020-02-23 ENCOUNTER — Ambulatory Visit: Payer: Self-pay | Admitting: Physician Assistant

## 2020-02-23 ENCOUNTER — Ambulatory Visit: Payer: Self-pay

## 2020-02-23 ENCOUNTER — Other Ambulatory Visit: Payer: Self-pay

## 2020-02-23 VITALS — BP 105/67 | HR 105 | Temp 97.2°F | Wt 236.4 lb

## 2020-02-23 DIAGNOSIS — Z98891 History of uterine scar from previous surgery: Secondary | ICD-10-CM

## 2020-02-23 DIAGNOSIS — Z3480 Encounter for supervision of other normal pregnancy, unspecified trimester: Secondary | ICD-10-CM

## 2020-02-23 DIAGNOSIS — O09299 Supervision of pregnancy with other poor reproductive or obstetric history, unspecified trimester: Secondary | ICD-10-CM

## 2020-02-23 DIAGNOSIS — O99013 Anemia complicating pregnancy, third trimester: Secondary | ICD-10-CM

## 2020-02-23 NOTE — Progress Notes (Signed)
   PRENATAL VISIT NOTE  Subjective:  Pamela Mack is a 24 y.o. G3P2002 at [redacted]w[redacted]d being seen today for ongoing prenatal care.  She is currently monitored for the following issues for this high-risk pregnancy and has History of 2 cesarean sections; Supervision of other normal pregnancy, antepartum; History of pre-eclampsia in prior pregnancy, currently pregnant; Obesity affecting pregnancy in first trimester; and Anemia affecting pregnancy in third trimester on their problem list.  Patient reports ongoing bilat muffled hearing, intermittent frontal headache with watery eyes, nasal discharge and ear discomfort. Tylenol helps only slightly. Has not obtained/used nasal saline per my prior instructions..  Contractions: Irritability. Vag. Bleeding: None.  Movement: Present. Denies leaking of fluid/ROM.   The following portions of the patient's history were reviewed and updated as appropriate: allergies, current medications, past family history, past medical history, past social history, past surgical history and problem list. Problem list updated.  Objective:   Vitals:   02/23/20 1400  BP: 105/67  Pulse: 105  Temp: (!) 97.2 F (36.2 C)  Weight: (!) 236 lb 6.4 oz (107.2 kg)    Fetal Status: Fetal Heart Rate (bpm): 140 Fundal Height: 36 cm Movement: Present     General:  Alert, oriented and cooperative. Patient is in no acute distress.  Skin: Skin is warm and dry. No rash noted.   Cardiovascular: Normal heart rate noted  Respiratory: Normal respiratory effort, no problems with respiration noted  Abdomen: Soft, gravid, appropriate for gestational age.  Pain/Pressure: Absent     Pelvic: Cervical exam deferred        Extremities: Normal range of motion.  Edema: Trace  Mental Status: Normal mood and affect. Normal behavior. Normal judgment and thought content.  Ear exam shows bilat clear EAC, R TM grey without fluid, bubbles; landmarks well visualized. L TM pink but landmarks seen. Nares patent  with some yellow discharge. OP clear. Questionable mild tenderness over frontal sinuses bilat with percussion. Assessment and Plan:  Pregnancy: G3P2002 at [redacted]w[redacted]d  1. Supervision of other normal pregnancy, antepartum Pregnancy going well. Anticipatory guidance re: transition to weekly visits and routine 36-wk infection testing. Still suspect viral URI with eustachian tube dysfunction, doubt bacterial sinusitis or migraine. Enc frequent po fluids, and 2 sprays nasal saline 4-6 times daily. Will reassess at RV and consider alternate dx/treatment.  2. History of pre-eclampsia in prior pregnancy, currently pregnant BP reassuring. Will continue to monitor.  3. Anemia affecting pregnancy in third trimester Enc to continue oral iron.  4. History of 2 cesarean sections Enc to keep del plans appt as sched 03/05/20. Planning BTL.   Preterm labor symptoms and general obstetric precautions including but not limited to vaginal bleeding, contractions, leaking of fluid and fetal movement were reviewed in detail with the patient. Please refer to After Visit Summary for other counseling recommendations.  Return in about 1 week (around 03/01/2020) for Routine prenatal care.  Future Appointments  Date Time Provider Department Center  03/01/2020  2:00 PM [facility] PROVIDER [facility] None    Landry Dyke, PA-C

## 2020-02-23 NOTE — Progress Notes (Signed)
Patient here for MH RV at 35 3/7. States she can't hear well for last 3 weeks. States she also has noise in her head and ears. C/O headaches with vision changes and "noise in my ears". She states these things happen together and to take care of it she has to lie down and cries a lot. States she lies down because of pain #9 on 0-10 pain scale, pain in ears and head. State she had some pus and blood coming out her ears at last appointment. States provider looked in her ears and told her she had blood and pus and was told she didn't have an infection. States the sounds she is hearing is like birds or crickets. Patient states sounds never go away, but vary in intensity.Burt Knack, RN

## 2020-03-01 ENCOUNTER — Encounter: Payer: Self-pay | Admitting: Physician Assistant

## 2020-03-01 ENCOUNTER — Other Ambulatory Visit: Payer: Self-pay

## 2020-03-01 ENCOUNTER — Ambulatory Visit: Payer: Self-pay | Admitting: Physician Assistant

## 2020-03-01 VITALS — BP 104/60 | HR 111 | Temp 97.9°F | Wt 236.4 lb

## 2020-03-01 DIAGNOSIS — O99211 Obesity complicating pregnancy, first trimester: Secondary | ICD-10-CM

## 2020-03-01 DIAGNOSIS — Z3483 Encounter for supervision of other normal pregnancy, third trimester: Secondary | ICD-10-CM

## 2020-03-01 DIAGNOSIS — O99013 Anemia complicating pregnancy, third trimester: Secondary | ICD-10-CM

## 2020-03-01 DIAGNOSIS — O09299 Supervision of pregnancy with other poor reproductive or obstetric history, unspecified trimester: Secondary | ICD-10-CM

## 2020-03-01 DIAGNOSIS — Z98891 History of uterine scar from previous surgery: Secondary | ICD-10-CM

## 2020-03-01 MED ORDER — PRENATAL VITAMIN 27-0.8 MG PO TABS: 1.0000 | ORAL_TABLET | Freq: Every day | ORAL | 0 refills | Status: DC

## 2020-03-01 NOTE — Progress Notes (Signed)
Correctly verbalizes how to take iron tablet and PNV. Aware of [facility] delivery plans appt 03/05/2020 at 2:45 and aware to take $60. Self-collecting 36 week cultures. Jossie Ng, RN

## 2020-03-01 NOTE — Progress Notes (Signed)
   PRENATAL VISIT NOTE  Subjective:  Pamela Mack is a 24 y.o. G3P2002 at [redacted]w[redacted]d being seen today for ongoing prenatal care.  She is currently monitored for the following issues for this high-risk pregnancy and has History of 2 cesarean sections; Supervision of other normal pregnancy, antepartum; History of pre-eclampsia in prior pregnancy, currently pregnant; Obesity affecting pregnancy in first trimester; and Anemia affecting pregnancy in third trimester on their problem list.  Patient reports no complaints. Has been using saline nose spray per my instructions and notes ear/hearing symptoms are dramatically improved. Contractions: Irritability. Vag. Bleeding: None.  Movement: Present. Denies leaking of fluid/ROM.   The following portions of the patient's history were reviewed and updated as appropriate: allergies, current medications, past family history, past medical history, past social history, past surgical history and problem list. Problem list updated.  Objective:   Vitals:   03/01/20 1425  BP: 104/60  Pulse: (!) 111  Temp: 97.9 F (36.6 C)  Weight: 236 lb 6.4 oz (107.2 kg)    Fetal Status: Fetal Heart Rate (bpm): 156 Fundal Height: 37 cm Movement: Present  Presentation: Vertex  General:  Alert, oriented and cooperative. Patient is in no acute distress.  Skin: Skin is warm and dry. No rash noted.   Cardiovascular: Normal heart rate noted  Respiratory: Normal respiratory effort, no problems with respiration noted  Abdomen: Soft, gravid, appropriate for gestational age.  Pain/Pressure: Absent     Pelvic: Cervical exam deferred        Extremities: Normal range of motion.  Edema: Trace  Mental Status: Normal mood and affect. Normal behavior. Normal judgment and thought content.   Assessment and Plan:  Pregnancy: G3P2002 at [redacted]w[redacted]d  1. Prenatal care, subsequent pregnancy, third trimester Doing well. Suspected Eustachian tube dysfunction seems to be substantially  improved.routine 36-week testing today. Almost out of PNV, more given to continue qd. - GBS Culture - GC/Chlamydia Probe Amp([facility]) - Prenatal Vit-Fe Fumarate-FA (PRENATAL VITAMIN) 27-0.8 MG TABS; Take 1 tablet by mouth daily at 6 (six) AM.  Dispense: 100 tablet; Refill: 0  2. History of 2 cesarean sections Desires repeat C/S - to keep delivery plans appt as sched 03/05/20 at [facility].  3. Anemia affecting pregnancy in third trimester Continue oral iron. Last hgb 02/09/20 was 11.4 g/dL.  4. History of pre-eclampsia in prior pregnancy, currently pregnant Has been taking low-dose aspirin. BP consistently normal, no pre-eclampsia sx. Counseled to discontinue daily aspirin.  5. Obesity affecting pregnancy in first trimester Excessive 41-lb wt gain noted. Discontinue daily ASA now at 36 wks.   Preterm labor symptoms and general obstetric precautions including but not limited to vaginal bleeding, contractions, leaking of fluid and fetal movement were reviewed in detail with the patient. Please refer to After Visit Summary for other counseling recommendations.  Return in about 1 week (around 03/08/2020) for Routine prenatal care.  Future Appointments  Date Time Provider Department Center  03/08/2020  2:00 PM [facility] PROVIDER [facility] None    Landry Dyke, PA-C

## 2020-03-03 LAB — CHLAMYDIA/GC NAA, CONFIRMATION
Chlamydia trachomatis, NAA: NEGATIVE
Neisseria gonorrhoeae, NAA: NEGATIVE

## 2020-03-05 LAB — CULTURE, BETA STREP (GROUP B ONLY): Strep Gp B Culture: NEGATIVE

## 2020-03-08 ENCOUNTER — Encounter: Payer: Self-pay | Admitting: Physician Assistant

## 2020-03-08 ENCOUNTER — Ambulatory Visit: Payer: Self-pay | Admitting: Physician Assistant

## 2020-03-08 ENCOUNTER — Other Ambulatory Visit: Payer: Self-pay

## 2020-03-08 VITALS — BP 104/69 | HR 118 | Temp 98.0°F | Wt 238.6 lb

## 2020-03-08 DIAGNOSIS — Z3480 Encounter for supervision of other normal pregnancy, unspecified trimester: Secondary | ICD-10-CM

## 2020-03-08 DIAGNOSIS — Z98891 History of uterine scar from previous surgery: Secondary | ICD-10-CM

## 2020-03-08 DIAGNOSIS — O09299 Supervision of pregnancy with other poor reproductive or obstetric history, unspecified trimester: Secondary | ICD-10-CM

## 2020-03-08 DIAGNOSIS — O99013 Anemia complicating pregnancy, third trimester: Secondary | ICD-10-CM

## 2020-03-08 DIAGNOSIS — O99211 Obesity complicating pregnancy, first trimester: Secondary | ICD-10-CM

## 2020-03-08 NOTE — Progress Notes (Signed)
Per client, [facility] rescheduled delivery plans appt to 03/12/2020 at 1:30 pm due to provider schedule (numerous procedures this week). Jossie Ng, RN

## 2020-03-08 NOTE — Progress Notes (Signed)
° °  PRENATAL VISIT NOTE  Subjective:  Pamela Mack [name] is a 24 y.o. G3P2002 at [redacted]w[redacted]d being seen today for ongoing prenatal care.  She is currently monitored for the following issues for this high-risk pregnancy and has History of 2 cesarean sections; Supervision of other normal pregnancy, antepartum; History of pre-eclampsia in prior pregnancy, currently pregnant; Obesity affecting pregnancy in first trimester; and Anemia affecting pregnancy in third trimester on their problem list.  Patient reports backache.  Contractions: Not present. Vag. Bleeding: None.  Movement: Present. Denies leaking of fluid/ROM.   The following portions of the patient's history were reviewed and updated as appropriate: allergies, current medications, past family history, past medical history, past social history, past surgical history and problem list. Problem list updated.  Objective:   Vitals:   03/08/20 1355  BP: 104/69  Pulse: (!) 118  Temp: 98 F (36.7 C)  Weight: 238 lb 9.6 oz (108.2 kg)  Recheck pulse after pt seated in room for 10 min = 100.  Fetal Status: Fetal Heart Rate (bpm): 152 Fundal Height: 37 cm Movement: Present  Presentation: Vertex  General:  Alert, oriented and cooperative. Patient is in no acute distress.  Skin: Skin is warm and dry. No rash noted.   Cardiovascular: Normal heart rate noted  Respiratory: Normal respiratory effort, no problems with respiration noted  Abdomen: Soft, gravid, appropriate for gestational age.  Pain/Pressure: Present     Pelvic: Cervical exam deferred        Extremities: Normal range of motion.  Edema: Trace  Mental Status: Normal mood and affect. Normal behavior. Normal judgment and thought content.   Assessment and Plan:  Pregnancy: G3P2002 at [redacted]w[redacted]d  1. Supervision of other normal pregnancy, antepartum Doing well. Enc to push fluids and stay cool in light of excessively hot weather.  2. History of 2 cesarean sections To keep resched del plans appt on  8/16.   3. History of pre-eclampsia in prior pregnancy, currently pregnant No concerning sx.  4. Obesity affecting pregnancy in first trimester 43.5lb TWG = excessive.   5. Anemia affecting pregnancy in third trimester Continue oral iron.   Term labor symptoms and general obstetric precautions including but not limited to vaginal bleeding, contractions, leaking of fluid and fetal movement were reviewed in detail with the patient. Please refer to After Visit Summary for other counseling recommendations.  Return in about 1 week (around 03/15/2020) for Routine prenatal care.  No future appointments.  Landry Dyke, PA-C

## 2020-03-12 ENCOUNTER — Encounter: Payer: Self-pay | Admitting: Obstetrics and Gynecology

## 2020-03-12 ENCOUNTER — Observation Stay
Admission: EM | Admit: 2020-03-12 | Discharge: 2020-03-12 | Disposition: A | Discharge status: Home / Self Care | Payer: Self-pay | Attending: Obstetrics and Gynecology | Admitting: Obstetrics and Gynecology

## 2020-03-12 ENCOUNTER — Other Ambulatory Visit: Payer: Self-pay

## 2020-03-12 DIAGNOSIS — R109 Unspecified abdominal pain: Secondary | ICD-10-CM | POA: Diagnosis not present

## 2020-03-12 DIAGNOSIS — Z3A38 38 weeks gestation of pregnancy: Secondary | ICD-10-CM | POA: Diagnosis not present

## 2020-03-12 DIAGNOSIS — O26899 Other specified pregnancy related conditions, unspecified trimester: Secondary | ICD-10-CM | POA: Diagnosis present

## 2020-03-12 DIAGNOSIS — O26893 Other specified pregnancy related conditions, third trimester: Secondary | ICD-10-CM | POA: Diagnosis present

## 2020-03-12 MED ORDER — ACETAMINOPHEN 325 MG PO TABS: 650.0000 mg | ORAL_TABLET | ORAL | Status: DC | PRN

## 2020-03-12 MED ORDER — CALCIUM CARBONATE ANTACID 500 MG PO CHEW: 2.0000 | CHEWABLE_TABLET | ORAL | Status: DC | PRN

## 2020-03-12 NOTE — H&P (Signed)
Chief Complaint:   [name] is a 24 y.o. female here for Discuss Delivery Plans (referral [facility] - needs c section)  Referring provider: Jadene Pierini*   Pt presents with interpreter  History of Present Illness: Patient is pregnant at [redacted]w[redacted]d from [facility]. Patient's delivery mode will be a Repeat C-Section at the [facility] hospital. Indications include: hx of C/S x2 (first in [place]) hx of pre-eclampsia with emergency C/S.   Factors complicating this pregnancy: -Anemia -BMI 36  -Hx of C/S x2, one emergent    -Hx of pre-eclampsia in prior pregnancy, with emergency C/S   Today: Patient states she is experiencing a lot of pain currently and has been having lots of contractions. She is having daily pains very low and also in her lower back; this started at 32-34 weeks. She did go to the ER but was waiting for 3 hours and could no longer stand the pain so she left. She would like to have the surgery soon. She also states she no longer desires children and is requesting for permanent sterilization.   Patient states she is currently experiencing headaches and visual changes. These are new changes for her and have not happened in previous pregnancies. Her visual changes are described as her vision going black. She states she has been undergoing work up for these symptoms at the health department and they have all been normal. We have limited records.   Baby girl "Pamela Mack".  Past Medical History:  has a past medical history of Anemia, History of 2 cesarean sections, History of pre-eclampsia in prior pregnancy, currently pregnant, and Obesity affecting pregnancy.  Past Surgical History:  has a past surgical history that includes Cesarean section. Family History: family history is not on file. Social History:  reports that she has never smoked. She has never used smokeless tobacco. She reports previous alcohol use. She reports that she does not use drugs. OB/GYN History:  OB History     Gravida  3   Para  2   Term  2   Preterm      AB      Living  2     SAB      TAB      Ectopic      Molar      Multiple      Live Births           Allergies: is allergic to latex, natural rubber. Medications: Current Outpatient Medications:  .  aspirin 81 MG EC tablet, Take 81 mg by mouth once daily, Disp: , Rfl:  .  ferrous sulfate 325 (65 FE) MG tablet, Take 325 mg by mouth daily with breakfast, Disp: , Rfl:  .  prenatal vit-iron fum-folic ac (PRENAVITE) tablet, Take 1 tablet by mouth once daily, Disp: , Rfl:    Exam:   BP 120/76   Ht 172.7 cm (5\' 8" )   Wt (!) 108.2 kg (238 lb 9.6 oz)   LMP 07/08/2019 (Approximate) Comment: within days  BMI 36.28 kg/m   Constitutional:  General appearance: Well nourished, well developed female in no acute distress.  Neuro/psych:  Normal mood and affect. No gross motor deficits. Neck:  Supple, normal appearance.  Respiratory:  Normal respiratory effort, no use of accessory muscles Skin:  No visible rashes or external lesions  Reflexes of the leg: +1 on right, 0 on the left   Impression:   The primary encounter diagnosis was High-risk pregnancy, third trimester. Diagnoses of Request for  sterilization, History of 2 cesarean sections, History of pre-eclampsia in prior pregnancy, currently pregnant, Anemia affecting pregnancy in third trimester, Obesity affecting pregnancy in first trimester, Headache in pregnancy, antepartum, Vision changes, and Shaking were also pertinent to this visit.  Plan:   1. High Risk Pregnancy, Desire for sterilization  -Patient's external charts were reviewed, factors complicating this pregnancy reviewed. Patient desires repeat C-section with bilateral tubal ligation. Indications for repeat C-section include her hx of 2 prior C/S, hx of emergency C/S, hx of preeclampsia in prior pregnancy, desire for permanent sterilization. Patient has reassured [place] that she understands the risks of the procedure and  the risks of permanent sterilization specifically. Pt emphasizes that she desires sterilization. -Consents signed today -To be scheduled   2. Headaches, visual changes in pregnancy, shaking  -Reflex exam normal, BP was normal today  -Due to contractions today, will send her over to triage to be monitored  -Preeclampsia precautions given, pt expressed understanding   Diagnoses and all orders for this visit:  High-risk pregnancy, third trimester  Request for sterilization  History of 2 cesarean sections  History of pre-eclampsia in prior pregnancy, currently pregnant  Anemia affecting pregnancy in third trimester  Obesity affecting pregnancy in first trimester  Headache in pregnancy, antepartum  Vision changes  Shaking   Return if symptoms worsen or fail to improve.  ~~~~~~~~~~~~~~~~~~~~~~~~~~~~~~~~~~~~~~~~~~~~~~~~~~~~~~~~~~~~ This note is partially written by Thompson Caul, in the presence of and acting as the scribe of Dr. [name], who has reviewed, edited and added to the note to reflect her best personal medical judgment.  This note was generated in part with voice recognition software and I apologize for any typographical errors that were not detected and corrected.  Cecilie Kicks, MD

## 2020-03-12 NOTE — OB Triage Note (Addendum)
Pt presents from office. Pt states she has preeclampsia and has been having lower abd pain. Reports HA and episodes of blurry vision entire pregnancy. Reports lower abd pain for the past several weeks but increased the last few days. reports no HA or blurry vision currently. States tightening in her lower abd a couple times an hour. VSS. Monitors applied.

## 2020-03-12 NOTE — Progress Notes (Signed)
Pt given discharge instructions including follow up care and labor precautions. Pt verbalized understanding of scheduled csection for next week

## 2020-03-12 NOTE — Discharge Summary (Signed)
Pamela Mack [name] is a 24 y.o. female. She is at [redacted]w[redacted]d gestation. Patient's last menstrual period was 07/08/2019 (within days). Estimated Date of Delivery: 03/26/20  Prenatal care site: [facility]   Chief complaint: sent from office for labor eval   Interpreter used for communication. Reports intermittent abdominal pain that starts lower and radiates to side and back.  Denies LOF or vaginal bleeding. Endorses good fetal movement.  S: Resting comfortably. no CTX, no VB.no LOF,  Active fetal movement.   Maternal Medical History:  Past Medical Hx:  has a past medical history of Anemia affecting pregnancy in third trimester (02/09/2020), History of pre-eclampsia, Pre-eclampsia affecting childbirth, and Pregnancy induced hypertension.    Past Surgical Hx:  has a past surgical history that includes Cesarean section (01/17/2013) and Cesarean section (07/31/2016).   Allergies  Allergen Reactions  . Other [facility]'s-was witnessed by RN in [facility]-Hives around and under armband after placement.     Prior to Admission medications   Medication Sig Start Date End Date Taking? Authorizing Provider  ferrous sulfate 324 (65 Fe) MG TBEC Take 1 tablet (325 mg total) by mouth daily. 02/09/20  Yes Federico Flake, MD  Prenatal Vit-Fe Fumarate-FA (PRENATAL VITAMIN) 27-0.8 MG TABS Take 1 tablet by mouth daily at 6 (six) AM. 03/01/20  Yes Federico Flake, MD    Social History: She  reports that she has never smoked. She has never used smokeless tobacco. She reports previous alcohol use of about 2.0 standard drinks of alcohol per week. She reports that she does not use drugs.  Family History: family history includes Diabetes in her maternal grandfather; Heart murmur in her sister.  no history of gyn cancers  Review of Systems: A full review of systems was performed and negative except as noted in the HPI.    O:  BP 120/71   Pulse 91   Temp 98.5 F (36.9 C) (Oral)    Resp 17   LMP 07/08/2019 (Within Days)  No results found for this or any previous visit (from the past 48 hour(s)).   Constitutional: NAD, AAOx3  HE/ENT: extraocular movements grossly intact, moist mucous membranes CV: RRR PULM: nl respiratory effort, CTABL     Abd: gravid, non-tender, non-distended, soft      Ext: Non-tender, Nonedmeatous   Psych: mood appropriate, speech normal Cervix: Cl/Th/High  NST:  Baseline: 130 bpm  Variability: moderate Accelerations present x >2 Decelerations absent Time 40 mins   Assessment: 24 y.o. [redacted]w[redacted]d here for antenatal surveillance during pregnancy.  Principle diagnosis: Abdominal pain in pregnancy, not in labor, reassuring fetal status   Plan:  Labor: not present.   Fetal Wellbeing: Reassuring Cat 1 tracing.  Reactive NST   Repeat c/section scheduled for 03/19/2020 with Dr. Dalbert Garnet   D/c home stable, precautions reviewed, follow-up as scheduled on Thursday.  ----- Margaretmary Eddy, CNM Certified Nurse Midwife [facility]

## 2020-03-14 ENCOUNTER — Encounter
Admission: RE | Admit: 2020-03-14 | Discharge: 2020-03-14 | Disposition: A | Discharge status: Home / Self Care | Payer: MEDICAID | Source: Ambulatory Visit | Attending: Obstetrics and Gynecology | Admitting: Obstetrics and Gynecology

## 2020-03-14 ENCOUNTER — Other Ambulatory Visit: Payer: Self-pay

## 2020-03-14 NOTE — Patient Instructions (Signed)
Your procedure is scheduled on:  Su procedimiento est programado para:03/19/2020  Report to Presntese a: EMERGENCY DEPARTMENT 5:30 AM    Remember: Instructions that are not followed completely may result in serious medical risk, up to and including death,  or upon the discretion of your surgeon and anesthesiologist your surgery may need to be rescheduled.  Recuerde: Las instrucciones que no se siguen completamente Armed forces logistics/support/administrative officer en un riesgo de salud grave, incluyendo hasta  la [place] o a discrecin de su cirujano y Scientific laboratory technician, su ciruga se puede posponer.   __X_ 1.Do not eat food after midnight the night before your procedure. No    gum chewing or hard candies. You may drink clear liquids up to 2 hours     before you are scheduled to arrive for your surgery- DO not drink clear     Liquids within 2 hours of the start of your surgery.           No coma nada despus de la medianoche de la noche anterior a su    procedimiento. No coma chicles ni caramelos duros. Puede tomar    lquidos claros hasta 2 horas antes de su hora programada de llegada al     hospital para su procedimiento. No tome lquidos claros durante el     transcurso de las 2 horas de su llegada programada al hospital para su     procedimiento, ya que esto puede llevar a que su procedimiento se    retrase o tenga que volver a Magazine features editor.  Los lquidos claros incluyen:          - Agua           -No tome nada que no est en esta lista.  Los pacientes con diabetes tipo 1 y tipo 2 solo deben Printmaker.  Llame a la clnica de PreCare o a la unidad de Same Day Surgery si  tiene alguna pregunta sobre estas instrucciones.              _X__ 2.Do Not Smoke or use e-cigarettes For 24 Hours Prior to Your Surgery.    Do not use any chewable tobacco products for at least 6   hours prior to surgery.    No fume ni use cigarrillos electrnicos durante las 24 horas previas    a su [place].  No use ningn producto de tabaco  masticable durante   al menos 6 horas antes de la [place].     __X_ 3. No alcohol for 24 hours before or after surgery.    No tome alcohol durante las 24 horas antes ni despus de la [place].   ____4. Bring all medications with you on the day of surgery if instructed.    Lleve todos los medicamentos con usted el da de su ciruga si se le    ha indicado as.   __X__ 5. Notify your doctor if there is any change in your medical condition (cold,fever, infections).    Informe a su mdico si hay algn cambio en su condicin mdica  (resfriado, fiebre, infecciones).   Do not wear jewelry, make-up, hairpins, clips or nail polish.  No use joyas, maquillajes, pinzas/ganchos para el cabello ni esmalte de uas.  Do not wear lotions, powders, or perfumes. You may wear deodorant.  No use lociones, polvos o perfumes.  Puede usar desodorante.    Do not shave 48 hours prior to surgery. Men may shave face and neck.  No se afeite 48 horas antes  de la [place].  Los hombres pueden [employer] cara  y el cuello.   Do not bring valuables to the hospital.   No lleve objetos de valor al hospital.  [facility] is not responsible for any belongings or valuables.  [place] no se hace responsable de ningn tipo de pertenencias u objetos de Licensed conveyancer.               Contacts, dentures or bridgework may not be worn into surgery.  Los lentes de Anatone, las dentaduras postizas o puentes no se pueden usar en la [place].   Leave your suitcase in the car. After surgery it may be brought to your room.  Deje su maleta en el auto.  Despus de la ciruga podr traerla a su habitacin.   For patients admitted to the hospital, discharge time is determined by your  treatment team.  Para los pacientes que sean ingresados al hospital, el tiempo en el cual se le  dar de alta es determinado por su equipo de [place].   Patients discharged the day of surgery will not be allowed to drive home. A los pacientes que se les  da de alta el mismo da de la ciruga no se les permitir conducir a Higher education careers adviser.    ___X_ Take these medicines the morning of surgery with A SIP OF WATER:          Tome estas medicinas la maana de la ciruga con UN SORBO DE AGUA:    [place]     ____ Use CHG Soap as directed          Utilice el jabn de CHG segn lo indica    ____ Stop supplements until after surgery            Deje de tomar suplementos hasta despus de la [place]

## 2020-03-15 ENCOUNTER — Ambulatory Visit: Payer: Self-pay | Admitting: Physician Assistant

## 2020-03-15 ENCOUNTER — Encounter: Payer: Self-pay | Admitting: Physician Assistant

## 2020-03-15 VITALS — BP 106/61 | HR 115 | Temp 98.5°F | Wt 241.0 lb

## 2020-03-15 DIAGNOSIS — O26899 Other specified pregnancy related conditions, unspecified trimester: Secondary | ICD-10-CM

## 2020-03-15 DIAGNOSIS — R109 Unspecified abdominal pain: Secondary | ICD-10-CM

## 2020-03-15 DIAGNOSIS — O99013 Anemia complicating pregnancy, third trimester: Secondary | ICD-10-CM

## 2020-03-15 DIAGNOSIS — Z98891 History of uterine scar from previous surgery: Secondary | ICD-10-CM

## 2020-03-15 DIAGNOSIS — Z3480 Encounter for supervision of other normal pregnancy, unspecified trimester: Secondary | ICD-10-CM

## 2020-03-15 DIAGNOSIS — O09299 Supervision of pregnancy with other poor reproductive or obstetric history, unspecified trimester: Secondary | ICD-10-CM

## 2020-03-15 DIAGNOSIS — O99211 Obesity complicating pregnancy, first trimester: Secondary | ICD-10-CM

## 2020-03-15 NOTE — Progress Notes (Signed)
   PRENATAL VISIT NOTE  Subjective:  Pamela Mack is a 24 y.o. G3P2002 at [redacted]w[redacted]d being seen today for ongoing prenatal care.  She is currently monitored for the following issues for this high-risk pregnancy and has History of 2 cesarean sections; Supervision of other normal pregnancy, antepartum; History of pre-eclampsia in prior pregnancy, currently pregnant; Obesity affecting pregnancy in first trimester; Anemia affecting pregnancy in third trimester; and Abdominal pain affecting pregnancy on their problem list.  Patient reports fatigue, no UCs since yesterday, pelvic pain improved since hosptital eva..  Contractions: Not present. Vag. Bleeding: None.  Movement: Present. Denies leaking of fluid/ROM.   The following portions of the patient's history were reviewed and updated as appropriate: allergies, current medications, past family history, past medical history, past social history, past surgical history and problem list. Problem list updated.  Objective:   Vitals:   03/15/20 1411  BP: 106/61  Pulse: (!) 115  Temp: 98.5 F (36.9 C)  Weight: 241 lb (109.3 kg)  Recheck pulse during visit = 101.  Fetal Status: Fetal Heart Rate (bpm): 146 Fundal Height: 39 cm Movement: Present  Presentation: Vertex  General:  Alert, oriented and cooperative. Patient is in no acute distress.  Skin: Skin is warm and dry. No rash noted.   Cardiovascular: Normal heart rate noted  Respiratory: Normal respiratory effort, no problems with respiration noted  Abdomen: Soft, gravid, appropriate for gestational age.  Pain/Pressure: Present     Pelvic: Cervical exam deferred        Extremities: Normal range of motion.  Edema: Trace  Mental Status: Normal mood and affect. Normal behavior. Normal judgment and thought content.   Assessment and Plan:  Pregnancy: G3P2002 at [redacted]w[redacted]d  1. Supervision of other normal pregnancy, antepartum Enc po fluids, rest, light meals until delivery. Sent to financial admin at  [facility] re: patient questions about Medicaid/payment for delivery/BTL.  2. Anemia affecting pregnancy in third trimester Continue oral iron as previously instructed.  3. History of 2 cesarean sections Had del plans appt, enc to keep repeat C/S appt as sched on 8/23.  4. Abdominal pain affecting pregnancy Seems substantially improved.  5. History of pre-eclampsia in prior pregnancy, currently pregnant BP WNL, no significant sx.  6. Obesity affecting pregnancy in first trimester 46-lb wt gain noted.   Term labor symptoms and general obstetric precautions including but not limited to vaginal bleeding, contractions, leaking of fluid and fetal movement were reviewed in detail with the patient. Please refer to After Visit Summary for other counseling recommendations.  Return in about 7 weeks (around 05/03/2020) for routine postpartum exam.  Future Appointments  Date Time Provider Department Center  03/16/2020 10:15 AM [facility] None    Landry Dyke, PA-C

## 2020-03-16 ENCOUNTER — Other Ambulatory Visit: Payer: Self-pay

## 2020-03-16 ENCOUNTER — Other Ambulatory Visit
Admission: RE | Admit: 2020-03-16 | Discharge: 2020-03-16 | Disposition: A | Discharge status: Home / Self Care | Payer: Self-pay | Source: Ambulatory Visit | Attending: Obstetrics and Gynecology | Admitting: Obstetrics and Gynecology

## 2020-03-16 DIAGNOSIS — Z20822 Contact with and (suspected) exposure to covid-19: Secondary | ICD-10-CM | POA: Diagnosis not present

## 2020-03-16 DIAGNOSIS — Z01812 Encounter for preprocedural laboratory examination: Secondary | ICD-10-CM | POA: Diagnosis not present

## 2020-03-16 LAB — BASIC METABOLIC PANEL
Anion gap: 11 (ref 5–15)
BUN: 5 mg/dL — ABNORMAL LOW (ref 6–20)
CO2: 18 mmol/L — ABNORMAL LOW (ref 22–32)
Calcium: 9.2 mg/dL (ref 8.9–10.3)
Chloride: 107 mmol/L (ref 98–111)
Creatinine, Ser: 0.45 mg/dL (ref 0.44–1.00)
GFR calc Af Amer: >60 mL/min (ref >60)
GFR calc non Af Amer: >60 mL/min (ref >60)
Glucose, Bld: 112 mg/dL — ABNORMAL HIGH (ref 70–99)
Potassium: 2.9 mmol/L — ABNORMAL LOW (ref 3.5–5.1)
Sodium: 136 mmol/L (ref 135–145)

## 2020-03-16 LAB — CBC
HCT: 32.2 % — ABNORMAL LOW (ref 36.0–46.0)
Hemoglobin: 11.3 g/dL — ABNORMAL LOW (ref 12.0–15.0)
MCH: 29.4 pg (ref 26.0–34.0)
MCHC: 35.1 g/dL (ref 30.0–36.0)
MCV: 83.9 fL (ref 80.0–100.0)
Platelets: 231 10*3/uL (ref 150–400)
RBC: 3.84 MIL/uL — ABNORMAL LOW (ref 3.87–5.11)
RDW: 13 % (ref 11.5–15.5)
WBC: 11.1 10*3/uL — ABNORMAL HIGH (ref 4.0–10.5)
nRBC: 0 % (ref 0.0–0.2)

## 2020-03-16 NOTE — Progress Notes (Addendum)
  [facility] Perioperative Services: Pre-Admission/Anesthesia Testing  Abnormal Lab Notification    Date: 03/16/20  Name: Pamela Mack MRN:   [id number]  Re: Abnormal labs noted during PAT appointment   Provider(s) Notified: [name], MD Notification mode: Routed and/or faxed via [facility]   ABNORMAL LAB VALUE(S): Lab Results  Component Value Date   K 2.9 (L) 03/16/2020   Notes: This patient is not on any type of diuretic therapy. She is scheduled for cesarean section on 03/19/2020. Will forward result to OB/GYN for optimization. Will have nursing staff re-check K+ on the day of procedure; order entered. This is a Personal assistant; no formal response is required.  Quentin Mulling, MSN, APRN, FNP-C, CEN [facility]  Peri-operative Services Nurse Practitioner Phone: [phone] 03/16/20 1:17 PM

## 2020-03-17 LAB — TYPE AND SCREEN
ABO/RH(D): A POS
Antibody Screen: NEGATIVE
Extend sample reason: UNDETERMINED

## 2020-03-17 LAB — SARS CORONAVIRUS 2 (TAT 6-24 HRS): SARS Coronavirus 2: NEGATIVE

## 2020-03-19 ENCOUNTER — Inpatient Hospital Stay: Payer: Medicaid Other | Admitting: Urgent Care

## 2020-03-19 ENCOUNTER — Encounter: Payer: Self-pay | Admitting: Obstetrics and Gynecology

## 2020-03-19 ENCOUNTER — Inpatient Hospital Stay
Admission: RE | Admit: 2020-03-19 | Discharge: 2020-03-21 | DRG: 785 | Disposition: A | Discharge status: Home / Self Care | Payer: Medicaid Other | Attending: Obstetrics and Gynecology | Admitting: Obstetrics and Gynecology

## 2020-03-19 ENCOUNTER — Other Ambulatory Visit: Payer: Self-pay

## 2020-03-19 ENCOUNTER — Encounter
Admission: RE | Disposition: A | Discharge status: Home / Self Care | Payer: Self-pay | Source: Home / Self Care | Attending: Obstetrics and Gynecology

## 2020-03-19 DIAGNOSIS — Z3A38 38 weeks gestation of pregnancy: Secondary | ICD-10-CM | POA: Diagnosis not present

## 2020-03-19 DIAGNOSIS — Z91040 Latex allergy status: Secondary | ICD-10-CM | POA: Diagnosis not present

## 2020-03-19 DIAGNOSIS — O9902 Anemia complicating childbirth: Secondary | ICD-10-CM | POA: Diagnosis present

## 2020-03-19 DIAGNOSIS — O99214 Obesity complicating childbirth: Secondary | ICD-10-CM | POA: Diagnosis present

## 2020-03-19 DIAGNOSIS — O0993 Supervision of high risk pregnancy, unspecified, third trimester: Secondary | ICD-10-CM

## 2020-03-19 DIAGNOSIS — Z302 Encounter for sterilization: Secondary | ICD-10-CM | POA: Diagnosis not present

## 2020-03-19 DIAGNOSIS — E669 Obesity, unspecified: Secondary | ICD-10-CM | POA: Diagnosis present

## 2020-03-19 DIAGNOSIS — O34211 Maternal care for low transverse scar from previous cesarean delivery: Secondary | ICD-10-CM | POA: Diagnosis present

## 2020-03-19 LAB — POTASSIUM: Potassium: 3.1 mmol/L — ABNORMAL LOW (ref 3.5–5.1)

## 2020-03-19 LAB — ABO/RH: ABO/RH(D): A POS

## 2020-03-19 LAB — RPR: RPR Ser Ql: NONREACTIVE — AB

## 2020-03-19 SURGERY — Surgical Case
Anesthesia: Spinal | Laterality: Bilateral

## 2020-03-19 MED ORDER — NALBUPHINE HCL 10 MG/ML IJ SOLN: 5.0000 mg | Freq: Once | INTRAMUSCULAR | Status: AC | PRN

## 2020-03-19 MED ORDER — SENNOSIDES-DOCUSATE SODIUM 8.6-50 MG PO TABS
2.0000 | ORAL_TABLET | ORAL | Status: DC
  Administered 2020-03-20 – 2020-03-21 (×2): 2 via ORAL
  Filled 2020-03-19 (×2): qty 2

## 2020-03-19 MED ORDER — MEPERIDINE HCL 25 MG/ML IJ SOLN: 6.2500 mg | INTRAMUSCULAR | Status: DC | PRN

## 2020-03-19 MED ORDER — ACETAMINOPHEN 500 MG PO TABS
1000.0000 mg | ORAL_TABLET | Freq: Four times a day (QID) | ORAL | Status: DC
  Administered 2020-03-19 – 2020-03-21 (×9): 1000 mg via ORAL
  Filled 2020-03-19 (×8): qty 2

## 2020-03-19 MED ORDER — GABAPENTIN 300 MG PO CAPS
300.0000 mg | ORAL_CAPSULE | Freq: Every day | ORAL | Status: DC
  Administered 2020-03-19 – 2020-03-20 (×2): 300 mg via ORAL
  Filled 2020-03-19 (×2): qty 1

## 2020-03-19 MED ORDER — BISACODYL 10 MG RE SUPP: 10.0000 mg | Freq: Every day | RECTAL | Status: DC | PRN

## 2020-03-19 MED ORDER — ONDANSETRON HCL 4 MG/2ML IJ SOLN: 4.0000 mg | Freq: Three times a day (TID) | INTRAMUSCULAR | Status: DC | PRN

## 2020-03-19 MED ORDER — SIMETHICONE 80 MG PO CHEW: 80.0000 mg | CHEWABLE_TABLET | ORAL | Status: DC

## 2020-03-19 MED ORDER — PHENYLEPHRINE HCL (PRESSORS) 10 MG/ML IV SOLN
INTRAVENOUS | Status: AC
  Filled 2020-03-19: qty 1

## 2020-03-19 MED ORDER — FLEET ENEMA 7-19 GM/118ML RE ENEM: 1.0000 | ENEMA | Freq: Every day | RECTAL | Status: DC | PRN

## 2020-03-19 MED ORDER — POVIDONE-IODINE 10 % EX SWAB: 2.0000 "application " | Freq: Once | CUTANEOUS | Status: DC

## 2020-03-19 MED ORDER — OXYTOCIN-SODIUM CHLORIDE 30-0.9 UT/500ML-% IV SOLN
INTRAVENOUS | Status: DC | PRN
  Administered 2020-03-19 (×2): 500 mL via INTRAVENOUS

## 2020-03-19 MED ORDER — FERROUS SULFATE 325 (65 FE) MG PO TABS
325.0000 mg | ORAL_TABLET | Freq: Two times a day (BID) | ORAL | Status: DC
  Administered 2020-03-19 – 2020-03-21 (×4): 325 mg via ORAL
  Filled 2020-03-19 (×5): qty 1

## 2020-03-19 MED ORDER — NALOXONE HCL 0.4 MG/ML IJ SOLN: 0.4000 mg | INTRAMUSCULAR | Status: DC | PRN

## 2020-03-19 MED ORDER — ACETAMINOPHEN 500 MG PO TABS
1000.0000 mg | ORAL_TABLET | Freq: Four times a day (QID) | ORAL | Status: AC
  Filled 2020-03-19: qty 2

## 2020-03-19 MED ORDER — CHLORHEXIDINE GLUCONATE 0.12 % MT SOLN
15.0000 mL | Freq: Once | OROMUCOSAL | Status: AC
  Administered 2020-03-19: 15 mL via OROMUCOSAL
  Filled 2020-03-19 (×2): qty 15

## 2020-03-19 MED ORDER — MENTHOL 3 MG MT LOZG
1.0000 | LOZENGE | OROMUCOSAL | Status: DC | PRN
  Filled 2020-03-19: qty 9

## 2020-03-19 MED ORDER — NALBUPHINE HCL 10 MG/ML IJ SOLN
5.0000 mg | Freq: Once | INTRAMUSCULAR | Status: AC | PRN
  Administered 2020-03-19: 5 mg via INTRAVENOUS

## 2020-03-19 MED ORDER — WITCH HAZEL-GLYCERIN EX PADS: 1.0000 "application " | MEDICATED_PAD | CUTANEOUS | Status: DC | PRN

## 2020-03-19 MED ORDER — ORAL CARE MOUTH RINSE: 15.0000 mL | Freq: Once | OROMUCOSAL | Status: AC

## 2020-03-19 MED ORDER — CEFAZOLIN SODIUM-DEXTROSE 2-4 GM/100ML-% IV SOLN
2.0000 g | INTRAVENOUS | Status: AC
  Administered 2020-03-19: 2 g via INTRAVENOUS
  Filled 2020-03-19: qty 100

## 2020-03-19 MED ORDER — OXYTOCIN-SODIUM CHLORIDE 30-0.9 UT/500ML-% IV SOLN
INTRAVENOUS | Status: AC
  Filled 2020-03-19: qty 500

## 2020-03-19 MED ORDER — SIMETHICONE 80 MG PO CHEW
80.0000 mg | CHEWABLE_TABLET | ORAL | Status: DC | PRN
  Administered 2020-03-20: 80 mg via ORAL
  Filled 2020-03-19: qty 1

## 2020-03-19 MED ORDER — DIPHENHYDRAMINE HCL 25 MG PO CAPS: 25.0000 mg | ORAL_CAPSULE | Freq: Four times a day (QID) | ORAL | Status: DC | PRN

## 2020-03-19 MED ORDER — ONDANSETRON HCL 4 MG/2ML IJ SOLN
INTRAMUSCULAR | Status: DC | PRN
  Administered 2020-03-19: 4 mg via INTRAVENOUS

## 2020-03-19 MED ORDER — PROPOFOL 10 MG/ML IV BOLUS
INTRAVENOUS | Status: AC
  Filled 2020-03-19: qty 20

## 2020-03-19 MED ORDER — FENTANYL CITRATE (PF) 100 MCG/2ML IJ SOLN
INTRAMUSCULAR | Status: AC
  Filled 2020-03-19: qty 2

## 2020-03-19 MED ORDER — KETOROLAC TROMETHAMINE 30 MG/ML IJ SOLN
30.0000 mg | Freq: Four times a day (QID) | INTRAMUSCULAR | Status: AC
  Filled 2020-03-19 (×2): qty 1

## 2020-03-19 MED ORDER — OXYCODONE HCL 5 MG PO TABS
5.0000 mg | ORAL_TABLET | ORAL | Status: DC | PRN
  Administered 2020-03-20 – 2020-03-21 (×2): 5 mg via ORAL
  Filled 2020-03-19 (×2): qty 1

## 2020-03-19 MED ORDER — TETANUS-DIPHTH-ACELL PERTUSSIS 5-2.5-18.5 LF-MCG/0.5 IM SUSP: 0.5000 mL | Freq: Once | INTRAMUSCULAR | Status: DC

## 2020-03-19 MED ORDER — SCOPOLAMINE 1 MG/3DAYS TD PT72: 1.0000 | MEDICATED_PATCH | Freq: Once | TRANSDERMAL | Status: DC

## 2020-03-19 MED ORDER — BUPIVACAINE IN DEXTROSE 0.75-8.25 % IT SOLN
INTRATHECAL | Status: DC | PRN
  Administered 2020-03-19: 1.6 mL via INTRATHECAL

## 2020-03-19 MED ORDER — MIDAZOLAM HCL 2 MG/2ML IJ SOLN
INTRAMUSCULAR | Status: AC
  Filled 2020-03-19: qty 2

## 2020-03-19 MED ORDER — NALBUPHINE HCL 10 MG/ML IJ SOLN: 5.0000 mg | INTRAMUSCULAR | Status: DC | PRN

## 2020-03-19 MED ORDER — DIPHENHYDRAMINE HCL 50 MG/ML IJ SOLN: 12.5000 mg | INTRAMUSCULAR | Status: DC | PRN

## 2020-03-19 MED ORDER — KETOROLAC TROMETHAMINE 30 MG/ML IJ SOLN
30.0000 mg | Freq: Four times a day (QID) | INTRAMUSCULAR | Status: AC
  Administered 2020-03-19 – 2020-03-20 (×4): 30 mg via INTRAVENOUS
  Filled 2020-03-19 (×3): qty 1

## 2020-03-19 MED ORDER — PRENATAL MULTIVITAMIN CH
1.0000 | ORAL_TABLET | Freq: Every day | ORAL | Status: DC
  Administered 2020-03-19 – 2020-03-21 (×3): 1 via ORAL
  Filled 2020-03-19 (×3): qty 1

## 2020-03-19 MED ORDER — ONDANSETRON HCL 4 MG/2ML IJ SOLN
INTRAMUSCULAR | Status: AC
  Filled 2020-03-19: qty 2

## 2020-03-19 MED ORDER — LACTATED RINGERS IV SOLN: INTRAVENOUS | Status: DC

## 2020-03-19 MED ORDER — PHENYLEPHRINE HCL (PRESSORS) 10 MG/ML IV SOLN
INTRAVENOUS | Status: DC | PRN
  Administered 2020-03-19: 100 ug via INTRAVENOUS

## 2020-03-19 MED ORDER — COCONUT OIL OIL
1.0000 "application " | TOPICAL_OIL | Status: DC | PRN
  Administered 2020-03-20: 1 via TOPICAL
  Filled 2020-03-19: qty 120

## 2020-03-19 MED ORDER — OXYTOCIN-SODIUM CHLORIDE 30-0.9 UT/500ML-% IV SOLN
2.5000 [IU]/h | INTRAVENOUS | Status: AC
  Administered 2020-03-19: 2.5 [IU]/h via INTRAVENOUS

## 2020-03-19 MED ORDER — SOD CITRATE-CITRIC ACID 500-334 MG/5ML PO SOLN
30.0000 mL | ORAL | Status: AC
  Administered 2020-03-19: 30 mL via ORAL
  Filled 2020-03-19: qty 30

## 2020-03-19 MED ORDER — SODIUM CHLORIDE 0.9 % IV SOLN
INTRAVENOUS | Status: DC | PRN
  Administered 2020-03-19: 50 ug/min via INTRAVENOUS

## 2020-03-19 MED ORDER — FENTANYL CITRATE (PF) 100 MCG/2ML IJ SOLN
INTRAMUSCULAR | Status: DC | PRN
Start: 2020-03-19 — End: 2020-03-19
  Administered 2020-03-19: 15 ug via INTRAVENOUS

## 2020-03-19 MED ORDER — SODIUM CHLORIDE FLUSH 0.9 % IV SOLN
INTRAVENOUS | Status: DC | PRN
  Administered 2020-03-19: 100 mL

## 2020-03-19 MED ORDER — BUPIVACAINE HCL (PF) 0.5 % IJ SOLN
INTRAMUSCULAR | Status: AC
  Filled 2020-03-19: qty 30

## 2020-03-19 MED ORDER — MORPHINE SULFATE (PF) 0.5 MG/ML IJ SOLN
INTRAMUSCULAR | Status: DC | PRN
Start: 2020-03-19 — End: 2020-03-19
  Administered 2020-03-19: .1 mg via EPIDURAL

## 2020-03-19 MED ORDER — MEASLES, MUMPS & RUBELLA VAC IJ SOLR
0.5000 mL | Freq: Once | INTRAMUSCULAR | Status: DC
  Filled 2020-03-19: qty 0.5

## 2020-03-19 MED ORDER — MORPHINE SULFATE (PF) 0.5 MG/ML IJ SOLN
INTRAMUSCULAR | Status: AC
  Filled 2020-03-19: qty 10

## 2020-03-19 MED ORDER — IBUPROFEN 800 MG PO TABS
800.0000 mg | ORAL_TABLET | Freq: Four times a day (QID) | ORAL | Status: DC
  Administered 2020-03-20 – 2020-03-21 (×5): 800 mg via ORAL
  Filled 2020-03-19 (×5): qty 1

## 2020-03-19 MED ORDER — BUPIVACAINE LIPOSOME 1.3 % IJ SUSP
20.0000 mL | Freq: Once | INTRAMUSCULAR | Status: DC
  Filled 2020-03-19: qty 20

## 2020-03-19 MED ORDER — NALOXONE HCL 4 MG/10ML IJ SOLN
1.0000 ug/kg/h | INTRAVENOUS | Status: DC | PRN
  Filled 2020-03-19: qty 5

## 2020-03-19 MED ORDER — DIBUCAINE (PERIANAL) 1 % EX OINT: 1.0000 "application " | TOPICAL_OINTMENT | CUTANEOUS | Status: DC | PRN

## 2020-03-19 MED ORDER — SODIUM CHLORIDE 0.9% FLUSH: 3.0000 mL | INTRAVENOUS | Status: DC | PRN

## 2020-03-19 MED ORDER — SODIUM CHLORIDE (PF) 0.9 % IJ SOLN
INTRAMUSCULAR | Status: AC
  Filled 2020-03-19: qty 50

## 2020-03-19 MED ORDER — NALBUPHINE HCL 10 MG/ML IJ SOLN
5.0000 mg | INTRAMUSCULAR | Status: DC | PRN
  Filled 2020-03-19: qty 1

## 2020-03-19 MED ORDER — DIPHENHYDRAMINE HCL 25 MG PO CAPS: 25.0000 mg | ORAL_CAPSULE | ORAL | Status: DC | PRN

## 2020-03-19 MED ORDER — SIMETHICONE 80 MG PO CHEW
80.0000 mg | CHEWABLE_TABLET | Freq: Three times a day (TID) | ORAL | Status: DC
  Administered 2020-03-19 – 2020-03-21 (×7): 80 mg via ORAL
  Filled 2020-03-19 (×7): qty 1

## 2020-03-19 SURGICAL SUPPLY — 28 items
CANISTER SUCT 3000ML PPV (MISCELLANEOUS) ×3 IMPLANT
CHLORAPREP W/TINT 26 (MISCELLANEOUS) ×3 IMPLANT
COVER WAND RF STERILE (DRAPES) ×3 IMPLANT
DRSG TELFA 3X8 NADH (GAUZE/BANDAGES/DRESSINGS) ×3 IMPLANT
ELECT REM PT RETURN 9FT ADLT (ELECTROSURGICAL) ×3
ELECTRODE REM PT RTRN 9FT ADLT (ELECTROSURGICAL) ×1 IMPLANT
GAUZE SPONGE 4X4 12PLY STRL (GAUZE/BANDAGES/DRESSINGS) ×3 IMPLANT
GOWN STRL REUS W/ TWL LRG LVL3 (GOWN DISPOSABLE) ×3 IMPLANT
GOWN STRL REUS W/TWL LRG LVL3 (GOWN DISPOSABLE) ×6
NEEDLE HYPO 25GX1X1/2 BEV (NEEDLE) ×3 IMPLANT
NS IRRIG 1000ML POUR BTL (IV SOLUTION) ×3 IMPLANT
PACK C SECTION (MISCELLANEOUS) ×3 IMPLANT
PAD OB MATERNITY 4.3X12.25 (PERSONAL CARE ITEMS) ×3 IMPLANT
PAD PREP 24X41 OB/GYN DISP (PERSONAL CARE ITEMS) ×3 IMPLANT
PENCIL SMOKE ULTRAEVAC 22 CON (MISCELLANEOUS) ×3 IMPLANT
STAPLER INSORB 30 2030 C-SECTI (MISCELLANEOUS) ×3 IMPLANT
SUT MNCRL 4-0 (SUTURE) ×2
SUT MNCRL 4-0 27XMFL (SUTURE) ×1
SUT VIC AB 0 CT1 36 (SUTURE) ×9 IMPLANT
SUT VIC AB 0 CTX 36 (SUTURE) ×4
SUT VIC AB 0 CTX36XBRD ANBCTRL (SUTURE) ×2 IMPLANT
SUT VIC AB 2-0 CT1 27 (SUTURE) ×2
SUT VIC AB 2-0 CT1 TAPERPNT 27 (SUTURE) ×1 IMPLANT
SUT VIC AB 2-0 SH 27 (SUTURE) ×4
SUT VIC AB 2-0 SH 27XBRD (SUTURE) ×2 IMPLANT
SUTURE MNCRL 4-0 27XMF (SUTURE) ×1 IMPLANT
SYR 30ML LL (SYRINGE) ×6 IMPLANT
TAPE PAPER 1X10 WHT MICROPORE (GAUZE/BANDAGES/DRESSINGS) ×3 IMPLANT

## 2020-03-19 NOTE — Anesthesia Preprocedure Evaluation (Signed)
Anesthesia Evaluation  Patient identified by MRN, date of birth, ID band Patient awake    Reviewed: Allergy & Precautions, H&P , NPO status , Patient's Chart, lab work & pertinent test results  Airway Mallampati: III  TM Distance: >3 FB     Dental  (+) Partial Upper   Pulmonary neg pulmonary ROS,    breath sounds clear to auscultation       Cardiovascular Exercise Tolerance: Good (-) hypertensionnegative cardio ROS   Rhythm:regular Rate:Normal     Neuro/Psych    GI/Hepatic negative GI ROS,   Endo/Other    Renal/GU   negative genitourinary   Musculoskeletal   Abdominal   Peds  Hematology negative hematology ROS (+)   Anesthesia Other Findings Past Medical History: 02/09/2020: Anemia affecting pregnancy in third trimester No date: History of pre-eclampsia No date: Pre-eclampsia affecting childbirth No date: Pregnancy induced hypertension  Past Surgical History: 01/17/2013: CESAREAN SECTION     Comment:  [place] 07/31/2016: CESAREAN SECTION     Comment:  scheduled  BMI    Body Mass Index: 38.90 kg/m      Reproductive/Obstetrics (+) Pregnancy                             Anesthesia Physical Anesthesia Plan  ASA: II  Anesthesia Plan: Spinal   Post-op Pain Management:    Induction:   PONV Risk Score and Plan:   Airway Management Planned:   Additional Equipment:   Intra-op Plan:   Post-operative Plan:   Informed Consent: I have reviewed the patients History and Physical, chart, labs and discussed the procedure including the risks, benefits and alternatives for the proposed anesthesia with the patient or authorized representative who has indicated his/her understanding and acceptance.     Dental Advisory Given  Plan Discussed with: Anesthesiologist  Anesthesia Plan Comments:         Anesthesia Quick Evaluation

## 2020-03-19 NOTE — Anesthesia Procedure Notes (Signed)
Spinal  Patient location during procedure: OR Start time: 03/19/2020 7:50 AM End time: 03/19/2020 7:53 AM Staffing Performed: anesthesiologist  Anesthesiologist: Tera Mater, MD Preanesthetic Checklist Completed: patient identified, IV checked, site marked, risks and benefits discussed, surgical consent, monitors and equipment checked, pre-op evaluation and timeout performed Spinal Block Patient position: sitting Prep: ChloraPrep Patient monitoring: heart rate, continuous pulse ox, blood pressure and cardiac monitor Approach: midline Location: L4-5 Injection technique: single-shot Needle Needle type: Whitacre and Introducer  Needle gauge: 24 G Needle length: 9 cm Additional Notes Negative paresthesia. Negative blood return. Positive free-flowing CSF. Expiration date of kit checked and confirmed. Patient tolerated procedure well, without complications.

## 2020-03-19 NOTE — Interval H&P Note (Signed)
History and Physical Interval Note:  03/19/2020 7:26 AM  Pamela Mack [name]  has presented today for surgery, with the diagnosis of repeat c-section / undesired fertility.  The various methods of treatment have been discussed with the patient and family. After consideration of risks, benefits and other options for treatment, the patient has consented to  Procedure(s): CESAREAN SECTION WITH BILATERAL TUBAL LIGATION (Bilateral) as a surgical intervention.  The patient's history has been reviewed, patient examined, no change in status, stable for surgery.  I have reviewed the patient's chart and labs.  Questions were answered to the patient's satisfaction.     [name]

## 2020-03-19 NOTE — Anesthesia Procedure Notes (Signed)
Date/Time: 03/19/2020 7:55 AM Performed by: Henrietta Hoover, CRNA Pre-anesthesia Checklist: Patient identified, Emergency Drugs available, Suction available, Patient being monitored and Timeout performed Oxygen Delivery Method: Nasal cannula Placement Confirmation: positive ETCO2

## 2020-03-19 NOTE — Transfer of Care (Signed)
Immediate Anesthesia Transfer of Care Note  Patient: Pamela Mack  Procedure(s) Performed: CESAREAN SECTION WITH BILATERAL TUBAL LIGATION (Bilateral )  Patient Location: PACU and Mother/Baby  Anesthesia Type:Spinal  Level of Consciousness: awake, alert  and oriented  Airway & Oxygen Therapy: Patient Spontanous Breathing and Patient connected to nasal cannula oxygen  Post-op Assessment: Report given to RN and Post -op Vital signs reviewed and stable  Post vital signs: Reviewed and stable  Last Vitals:  Vitals Value Taken Time  BP 119/74 03/19/20 0938  Temp    Pulse 100 03/19/20 0939  Resp 10 03/19/20 0939  SpO2 99 % 03/19/20 0939  Vitals shown include unvalidated device data.  Last Pain:  Vitals:   03/19/20 0730  TempSrc: Oral         Complications: No complications documented.

## 2020-03-19 NOTE — Lactation Note (Signed)
This note was copied from a baby's chart. Lactation Consultation Note  Patient Name: Pamela Mack Today's Date: 03/19/2020 Reason for consult: Follow-up assessment;Mother's request;Difficult latch;Term (Mom gave bottle of formula against lactation advice)  Lactation has assisted mom with positioning with pillow support and latching [name] to the breast several times since birth.  Mom has been asking for formula since right after returning to her room after her C/S.  Through spanish interpreter, lactation has reviewed the risks to successful breast feeding and milk supply if she introduces bottles of formula too early but still gave 10 ml at the last feeding.   When assisted [name] to latch after getting bottle of formula, she was reluctant at first.  After several attempts, she latched deeply enough to sustain the latch with strong, rhyhmic sucking for 10 minutes on the left breast.  Mom was concerned about her nose close to the breast that she might not be able to breathe.  Even though pointed out that she was breathing fine, mom pulled back on the breast causing her to get a shallow latch and fall off the breast.  She was still rooting for the breast and sucking on her fist.  Relatched her to the right breast where she fed for another 5 minutes.  Mom denies any breast or nipple pain.  Encouraged mom to put [name] to the breast whenever she demonstrated feeding cues to bring in mature milk and ensure a plentiful milk supply.  Hand out given in spanish about what to expect with breast feedings the first 4 days of life and reviewed through interpreter normal newborn stomach size, supply and demand, adequate intake and out put, normal course of lactation and routine newborn feeding patterns.  Lactation name and number written on white board and encouraged to call with any questions, concerns or assistance. Maternal Data Formula Feeding for Exclusion: No Has patient been taught Hand Expression?:  Yes Does the patient have breastfeeding experience prior to this delivery?: Yes  Feeding Feeding Type: Breast Fed Nipple Type: Slow - flow  LATCH Score Latch: Repeated attempts needed to sustain latch, nipple held in mouth throughout feeding, stimulation needed to elicit sucking reflex.  Audible Swallowing: A few with stimulation  Type of Nipple: Everted at rest and after stimulation  Comfort (Breast/Nipple): Soft / non-tender  Hold (Positioning): Assistance needed to correctly position infant at breast and maintain latch.  LATCH Score: 7  Interventions Interventions: Breast feeding basics reviewed;Assisted with latch;Skin to skin;Breast massage;Hand express;Breast compression;Adjust position;Support pillows;Position options  Lactation Tools Discussed/Used WIC Program: Yes   Consult Status Consult Status: Follow-up Follow-up type: Call as needed    Louis Meckel 03/19/2020, 9:37 PM

## 2020-03-19 NOTE — Op Note (Signed)
Cesarean Section Procedure Note  Indications: Desired repeat  Pre-operative Diagnosis:  1. Intrauterine pregnancy at [redacted]w[redacted]d;  2. Desires permanent sterilization;  3. Prior c/s x2  Post-operative Diagnosis: same, delivered.  Procedure: 1. Low Transverse Cesarean Section through Pfannenstiel incision  2. Bilateral tubal sterilization using modified Parkland method  Surgeon: [name], MD  Assistant(s):  Margaretmary Eddy, CNM  Anesthesia: Spinal anesthesia  Estimated Blood Loss:  500         Drains: Foley         Total IV Fluids:  Urine Output: 100 ml         Specimens: None         Complications:  None; patient tolerated the procedure well.         Disposition: PACU - hemodynamically stable.         Condition: stable  Findings:  A female infant "[name]" in cephalic presentation. Amniotic fluid - Clear  Birth weight 6#11oz  Apgars of 9 and 9.   Intact placenta with a three-vessel cord.  Grossly normal uterus, tubes and ovaries bilaterally. Moderate intraabdominal adhesions were noted.  Procedure Details  The patient was taken to Operating Room, identified as the correct patient and the procedure verified as C-Section Delivery. A Time Out was held and the above information confirmed.  After induction of anesthesia, the patient was draped and prepped in the usual sterile manner. A Pfannenstiel incision was made and carried down through the subcutaneous tissue to the fascia. Fascial incision was made and extended transversely with the Mayo scissors. The fascia was separated from the underlying rectus tissue superiorly and inferiorly. The peritoneum was identified and entered bluntly. Peritoneal incision was extended longitudinally. The utero-vesical peritoneal reflection was incised transversely and a bladder flap was created digitally.   A low transverse hysterotomy was made. The fetus was delivered atraumatically. The umbilical cord was clamped x2 and cut and the  infant was handed to the awaiting pediatricians. The placenta was removed intact and appeared normal with a 3-vessel cord.   The uterus was exteriorized and cleared of all clot and debris. The hysterotomy was closed with running sutures of  0-Vicryl. A second imbricating layer was placed with the same suture. Excellent hemostasis was observed.   Attention was then turned to the tubal ligation. The left fallopian tube distinguished from the round ligament by identifying the fimbria and was grasped with a Babcock clamp in the midisthmic portion approximately 3 cm from the cornual region. It was then doubly ligated with 0-plain gut suture in a Parkland fashion. The tubal segment was excised with Metzenbaum scissors. Tubal ostea noted. The procedure was repeated on the right side. *Care was noted to examine both tubal sites in situ to ensure the sutures were intact and no bleeding was noted. The uterus was returned to the abdomen.  The pelvis was irrigated and again, excellent hemostasis was noted. The fascia was then reapproximated with running sutures of 0 Vicryl.  The subcutaneous tissue was reapproximated with running sutures of 0 vicryl. The skin was reapproximated with Insorb absorbable staples. 16ml of Exparel infused along fascial and skin incisions in standard fashion.  Instrument, sponge, and needle counts were correct prior to the abdominal closure and at the conclusion of the case.   The patient tolerated the procedure well and was transferred to the recovery room in stable condition.   [name], MD8/23/2021

## 2020-03-20 ENCOUNTER — Encounter: Payer: Self-pay | Admitting: Obstetrics and Gynecology

## 2020-03-20 LAB — CBC
HCT: 28.6 % — ABNORMAL LOW (ref 36.0–46.0)
Hemoglobin: 10.1 g/dL — ABNORMAL LOW (ref 12.0–15.0)
MCH: 30.3 pg (ref 26.0–34.0)
MCHC: 35.3 g/dL (ref 30.0–36.0)
MCV: 85.9 fL (ref 80.0–100.0)
Platelets: 201 10*3/uL (ref 150–400)
RBC: 3.33 MIL/uL — ABNORMAL LOW (ref 3.87–5.11)
RDW: 13.2 % (ref 11.5–15.5)
WBC: 11.2 10*3/uL — ABNORMAL HIGH (ref 4.0–10.5)
nRBC: 0 % (ref 0.0–0.2)

## 2020-03-20 NOTE — Anesthesia Postprocedure Evaluation (Signed)
Anesthesia Post Note  Patient: Pamela Mack, drilling  Procedure(s) Performed: CESAREAN SECTION WITH BILATERAL TUBAL LIGATION (Bilateral )  Patient location during evaluation: Mother Baby Anesthesia Type: Spinal Level of consciousness: oriented and awake and alert Pain management: pain level controlled Vital Signs Assessment: post-procedure vital signs reviewed and stable Respiratory status: spontaneous breathing and respiratory function stable Cardiovascular status: blood pressure returned to baseline and stable Postop Assessment: no headache, no backache, no apparent nausea or vomiting and able to ambulate Anesthetic complications: no   No complications documented.   Last Vitals:  Vitals:   03/20/20 0700 03/20/20 0900  BP:  116/68  Pulse: 96 94  Resp:  20  Temp:  36.5 C  SpO2: 96% 99%    Last Pain:  Vitals:   03/20/20 0900  TempSrc: Axillary  PainSc: 6                  Heavin Sebree,  Alessandra Bevels

## 2020-03-20 NOTE — Progress Notes (Signed)
Post Partum Day 1 Subjective: Doing well, no complaints.  Tolerating regular diet, pain with PO meds, voiding and ambulating without difficulty.  No CP SOB Fever,Chills, N/V or leg pain; denies nipple or breast pain, no HA change of vision, RUQ/epigastric pain  Objective: BP 124/76 (BP Location: Right Arm)   Pulse 96   Temp 98.2 F (36.8 C) (Oral)   Resp 20   Ht 5\' 6"  (1.676 m)   Wt 109.3 kg   LMP 07/08/2019 (Within Days)   SpO2 96%   Breastfeeding Unknown   BMI 38.90 kg/m    Physical Exam:  General: NAD Breasts: soft/nontender CV: RRR Pulm: nl effort, CTABL Abdomen: soft, NT, BS x 4 Incision: Dsg with approx 3cm x 2cm old dark red drainage marked Lochia: small Uterine Fundus: fundus firm and 2 fb below umbilicus DVT Evaluation: no cords, ttp LEs   Recent Labs    03/20/20 0542  HGB 10.1*  HCT 28.6*  WBC 11.2*  PLT 201    Assessment/Plan: 24 y.o. 22 postpartum day # 1  - Continue routine PP care- encouraged ambulation and shower today.  - Lactation consult prn - BTL done with CS.  - Immunization status: all Imms up to date    Disposition: Does not desire Dc home today.     [id number], CNM 03/20/2020

## 2020-03-21 LAB — SURGICAL PATHOLOGY

## 2020-03-21 MED ORDER — OXYCODONE HCL 5 MG PO TABS
5.0000 mg | ORAL_TABLET | ORAL | 0 refills | Status: AC | PRN
Start: 2020-03-21 — End: 2020-03-28

## 2020-03-21 MED ORDER — SENNOSIDES-DOCUSATE SODIUM 8.6-50 MG PO TABS: 2.0000 | ORAL_TABLET | Freq: Every day | ORAL | Status: DC | PRN

## 2020-03-21 MED ORDER — ACETAMINOPHEN 500 MG PO TABS: 1000.0000 mg | ORAL_TABLET | Freq: Four times a day (QID) | ORAL | 0 refills | Status: DC | PRN

## 2020-03-21 MED ORDER — IBUPROFEN 800 MG PO TABS: 800.0000 mg | ORAL_TABLET | Freq: Four times a day (QID) | ORAL | 1 refills | Status: DC | PRN

## 2020-03-21 NOTE — Progress Notes (Signed)
Pt discharged with infant. Discharge instructions, prescriptions, and follow up appointments given to and reviewed with patient with interpreter present. Incision care kit given to and reviewed with patient. Pt verbalized understanding. To be escorted out by auxillary.

## 2020-03-21 NOTE — Discharge Summary (Signed)
Obstetrical Discharge Summary  Patient Name: Pamela Mack DOB: 1995/10/18 MRN: [id number]  Date of Admission: 03/19/2020 Date of Delivery: 03/19/2020 Delivered by: Dr. [name]  Date of Discharge: 03/21/2020  Primary OB: [facility] TDV:VOHYWVP'X last menstrual period was 07/08/2019 (within days). EDC Estimated Date of Delivery: 03/26/20 Gestational Age at Delivery: [redacted]w[redacted]d   Antepartum complications:  -Anemia -BMI 36  -Hx of C/S x2, one emergent    -Hx of pre-eclampsia in prior pregnancy, with emergency C/S   Admitting Diagnosis: Repeatn c/section with BTL  Secondary Diagnosis: Patient Active Problem List   Diagnosis Date Noted  . Supervision of high risk pregnancy in third trimester 03/19/2020  . Abdominal pain affecting pregnancy 03/12/2020  . Anemia affecting pregnancy in third trimester 02/09/2020  . History of 2 cesarean sections 09/26/2019  . Supervision of other normal pregnancy, antepartum 09/26/2019  . History of pre-eclampsia in prior pregnancy, currently pregnant 09/26/2019  . Obesity affecting pregnancy in first trimester 09/26/2019    Augmentation: N/A Complications: None Intrapartum complications/course: Presented for scheduled c/section and BTL.  See OP note.  Delivery Type: primary cesarean section, low transverse incision Anesthesia: Spinal Placenta: Manual removal Laceration: none Episiotomy: none Newborn Data: Live born female  Birth Weight: 6 lb 11.2 oz (3040 g) APGAR: 9, 9  Newborn Delivery   Birth date/time: 03/19/2020 08:31:00 Delivery type: C-Section, Low Transverse Trial of labor: No C-section categorization: Repeat     Postpartum Procedures: none Edinburgh:  Edinburgh Postnatal Depression Scale Screening Tool 03/20/2020 03/19/2020  I have been able to laugh and see the funny side of things. (No Data) (No Data)     Post partum course:  Patient had an uncomplicated postpartum course.  By time of discharge on POD#2, her pain was  controlled on oral pain medications; she had appropriate lochia and was ambulating, voiding without difficulty, tolerating regular diet and passing flatus.   She was deemed stable for discharge to home.    Discharge Physical Exam:  BP 117/74 (BP Location: Left Arm)   Pulse 91   Temp 98.4 F (36.9 C) (Oral)   Resp 18   Ht 5\' 6"  (1.676 m)   Wt 109.3 kg   LMP 07/08/2019 (Within Days)   SpO2 99%   Breastfeeding Unknown   BMI 38.90 kg/m   General: NAD CV: RRR Pulm: CTABL, nl effort ABD: s/nd/nt, fundus firm and below the umbilicus Lochia: moderate Perineum:minimal edema/intact Incision: c/d/I DVT Evaluation: LE non-ttp, no evidence of DVT on exam.  Hemoglobin  Date Value Ref Range Status  03/20/2020 10.1 (L) 12.0 - 15.0 g/dL Final  03/22/2020 10/62/6948 11.1 - 15.9 g/dL Final   HCT  Date Value Ref Range Status  03/20/2020 28.6 (L) 36 - 46 % Final   Hematocrit  Date Value Ref Range Status  02/09/2020 33.5 (L) 34.0 - 46.6 % Final     Disposition: stable, discharge to home. Baby Feeding: breastmilk and formula Baby Disposition: home with mom  Rh Immune globulin given: Rh pos Rubella vaccine given: Immune Varivax vaccine given: Immune  Flu vaccine given in AP or PP setting: given 09/26/2019 Tdap vaccine given in AP or PP setting: given 01/02/2020  Contraception: BTL  Prenatal Labs:  Blood type/Rh A pos   Antibody screen neg  Rubella Immune  Varicella Immune  RPR NR  HBsAg Neg  HIV NR  GC neg  Chlamydia neg  Genetic screening negative  1 hour GTT 127  3 hour GTT N/A  GBS Negavie    Plan:  Pamela Mack [name] was discharged to home in good condition. Follow-up appointment with delivering provider in 2 weeks.  Discharge Medications: Allergies as of 03/21/2020      Reactions   Other [facility]'s-was witnessed by RN in [facility]-Hives around and under armband after placement.   Latex Itching, Rash      Medication List    TAKE these  medications   acetaminophen 500 MG tablet Commonly known as: TYLENOL Take 2 tablets (1,000 mg total) by mouth every 6 (six) hours as needed.   ferrous sulfate 324 (65 Fe) MG Tbec Take 1 tablet (325 mg total) by mouth daily.   ibuprofen 800 MG tablet Commonly known as: ADVIL Take 1 tablet (800 mg total) by mouth every 6 (six) hours as needed for moderate pain.   oxyCODONE 5 MG immediate release tablet Commonly known as: Oxy IR/ROXICODONE Take 1 tablet (5 mg total) by mouth every 4 (four) hours as needed for up to 7 days for moderate pain.   Prenatal Vitamin 27-0.8 MG Tabs Take 1 tablet by mouth daily at 6 (six) AM. What changed: when to take this   senna-docusate 8.6-50 MG tablet Commonly known as: Senokot-S Take 2 tablets by mouth daily as needed for mild constipation.        Follow-up Information    [name], MD Follow up in 2 week(s).   Specialty: Obstetrics and Gynecology Why: post-op visit  Contact information: [address] [place] [address] [phone]               Signed:  Margaretmary Eddy, CNM Certified Nurse Midwife [facility]

## 2020-03-21 NOTE — Discharge Instructions (Signed)
Cuidados en el postparto luego de un parto por cesrea Postpartum Care After Cesarean Delivery Lea esta informacin sobre cmo cuidarse desde el momento en que nazca su beb y Pamela Mack 6 a 12 semanas despus del parto (perodo del posparto). El mdico tambin podr darle indicaciones ms especficas. Comunquese con el mdico si tiene problemas o preguntas. Siga estas indicaciones en su casa: Medicamentos  [employer] International de venta libre y los recetados solamente como se lo haya indicado el mdico.  Si le recetaron un antibitico, tmelo como se lo haya indicado el mdico. No deje de tomar el antibitico aunque comience a sentirse mejor.  Pregntele al mdico si el medicamento recetado: ? Hace que sea necesario que evite conducir o usar maquinaria pesada. ? Puede causarle estreimiento. Es posible que deba tomar medidas para prevenir o tratar el estreimiento, por ejemplo:  Beber suficiente lquido como para Pharmacologist la orina de color amarillo plido.  Tomar medicamentos recetados o de [employer].  Consumir alimentos ricos en fibra, como frijoles, cereales integrales, y frutas y verduras frescas.  Limitar su consumo de alimentos ricos en grasa y azcares procesados, como los alimentos fritos o dulces. Actividad  Retome sus actividades normales de a poco segn lo indicado por el mdico.  Evite las actividades que demandan mucho esfuerzo y Engineer, drilling (que son extenuantes) hasta que el mdico se lo autorice. Siempre es ms seguro caminar a un ritmo tranquilo a moderado. Pregntele al mdico qu actividades son seguras para usted. ? No levante objetos que pesen ms que su beb o ms de 10 libras (4,5 kg), como se lo haya indicado el mdico. ? No pase la aspiradora, suba escaleras ni conduzca un vehculo durante el tiempo que le indique el mdico.  De ser posible, pdale a alguien que le brinde ayuda con las tareas domsticas hasta que pueda realizar sus actividades habituales por su  cuenta.  Descanse todo lo que pueda. Trate de descansar o tomar una siesta mientras el beb duerme. Hemorragia vaginal  Es normal tener un poco de hemorragia vaginal (loquios) despus del parto. Use un apsito sanitario para absorber el sangrado vaginal y la secrecin. ? Durante la primera semana despus del parto, la cantidad y el aspecto de los loquios a menudo es similar a las del perodo menstrual. ? Durante las siguientes semanas disminuir gradualmente hasta convertirse en una secrecin seca amarronada o [place]. ? En la Lennar Corporation, los loquios se detienen [employer] 4 a 6semanas despus del [place]. Los sangrados vaginales pueden variar de mujer a Nurse, learning disability.  Cambie los apsitos sanitarios con frecuencia. Observe si hay cambios en el flujo, como: ? Aumento repentino en el volumen. ? Cambio en el color. ? Cogulos sanguneos grandes.  Si expulsa un cogulo de sangre, gurdelo y llame al mdico para informrselo. No deseche los cogulos de sangre por el inodoro antes de recibir indicaciones del mdico.  No use tampones ni se haga duchas vaginales hasta que el mdico la autorice.  Si no est amamantando, volver a tener su perodo entre 6 y 8 semanas despus del parto. Si est amamantando, puede volver a tener su perodo [place] 8 semanas despus del parto y el momento en que deje de Museum/gallery exhibitions officer. Cuidados perineales   Si su cesrea no fue planeada, y pas por el proceso de [place] de parto y puj antes del nacimiento, podra Surveyor, mining, hinchazn y Associate Professor del tejido que se encuentra entre la abertura de la vagina y el ano (perineo). Tambin pueden  haberle hecho una incisin en el tejido (episiotoma) o el tejido puede haberse desgarrado durante el [place]. Siga las siguientes indicaciones como se lo haya indicado su mdico: ? Mantenga el perineo limpio y Personnel officer se lo haya indicado el mdico. Utilice apsitos o aerosoles analgsicos y Control and instrumentation engineer, como se lo hayan  indicado. ? Si le realizaron una episiotoma o un desgarro vaginal, controle la zona [employer] para detectar signos de infeccin. Est atento a los siguientes signos:  Enrojecimiento, Optician, dispensing.  Lquido o sangre.  Calor.  Pus o mal olor. ? Es posible que le den una botella rociadora para que use en lugar de limpiarse el rea con papel higinico despus de usar el bao. Cuando comience a Barrister's clerk, podr usar la botella rociadora antes de secarse. Asegrese de secarse suavemente. ? Para Engineer, materials causado por una episiotoma, un desgarro vaginal o hemorroides, trate de tomar un bao de asiento tibio 2 o 3 veces por da. Un bao de asiento es un bao de agua tibia que se toma mientras se est sentado. El agua solo debe Adult nurse las caderas y cubrir las nalgas. Cuidado de las [address]  En los [address] despus del parto, las mamas pueden sentirse pesadas, llenas e incmodas (congestin [place]). Tambin puede tener Mirant se escapa de sus senos. El mdico puede sugerirle mtodos para Systems analyst. La congestin mamaria debera desaparecer al cabo de [employer] Mutual [employer] Omaha.  Si est amamantando: ? Use un sostn que sujete y ajuste bien sus pechos. ? Mantenga los pezones secos y limpios. Aplquese cremas y Energy Transfer Partners se lo haya indicado el mdico. ? Es posible que deba usar discos de algodn en el sostn para Insurance account manager Mirant se filtre. ? Puede tener contracciones uterinas cada vez que amamante durante varias semanas despus del [place]. Las contracciones uterinas ayudan al tero a Hotel manager a su tamao habitual. ? Si tiene algn problema con la lactancia materna, consulte con su mdico o con un Holiday representative.  Si no est amamantando: ? Evite tocarse las [address], ya que esto puede hacer que produzcan ms [place]. ? Use un sostn que le proporcione el ajuste correcto y compresas fras para reducir la hinchazn. ? No extraiga (saque) [employer] Pal[employer]. Esto har que  produzca ms [employer]. Intimidad y sexualidad  Pregntele al mdico cundo puede retomar la actividad sexual. Esto puede depender de lo siguiente: ? Riesgo de sufrir una infeccin. ? Velocidad de cicatrizacin. ? Comodidad y deseo de Wachov[employer] Corporation sexual.  Despus del parto, puede quedar embarazada incluso si no ha tenido todava su perodo. Si lo desea, hable con el mdico acerca de los mtodos de planificacin familiar o control de la natalidad (mtodos anticonceptivos). Estilo de vida  No consuma ningn producto que contenga nicotina o tabaco, como cigarrillos, cigarrillos electrnicos y tabaco de Theatre manager. Si necesita ayuda para dejar de consumir, consulte al mdico.  No beba alcohol, especialmente si est amamantando. Comida y bebida   Beba suficiente lquido como para Pharmacologist la orina de color amarillo plido.  Coma alimentos ricos en [employer]. Estos pueden ayudarla a prevenir o Educational psychologist. Los alimentos ricos en fibra incluyen los siguientes: ? Panes y cereales integrales. ? Arroz integral. ? Armed forces operational officer. ? Nils Pyle y verduras frescas.  Tome sus vitaminas prenatales hasta la visita de control de posparto o hasta que su mdico le indique que puede dejar de tomarlas. Indicaciones generales  Concurra a todas las visitas de  control para usted y Research scientist (physical sciences) se lo haya indicado el mdico. La mayora de las mujeres visita al mdico para un control de posparto dentro de las primeras 3 a 6 semanas despus del [place]. Comunquese con un mdico si:  Se siente incapaz de controlar los cambios que implica tener un beb recin nacido y esos sentimientos no desaparecen.  Siente tristeza o preocupacin de forma inusual.  Siente dolor en las mamas, o estn duras o enrojecidas.  Tiene fiebre.  Tiene dificultad para retener la [place] o para impedir que la orina se escape.  Tiene poco inters o falta de inters en actividades que solan gustarle.  No ha  amamantado para nada y no ha tenido un perodo menstrual durante 12 semanas despus del [place].  Dej de amamantar al beb y no ha tenido su perodo menstrual durante 12 semanas despus de dejar de Museum/gallery exhibitions officer.  Tiene preguntas sobre su cuidado o el del beb.  Elimina un cogulo de sangre por la vagina. Solicite ayuda inmediatamente si:  Midwife.  Presenta dificultad para respirar.  Tiene un dolor repentino e intenso en la pierna.  Tiene dolor intenso o clicos en el abdomen.  Tiene un sangrado tan intenso en la vagina que empapa ms de un apsito sanitario en [employer]. El sangrado no debe ser ms abundante que el perodo ms intenso que haya tenido.  Presenta dolor de cabeza intenso.  Se desmaya.  Tiene visin borrosa o Nurse, adult.  Tiene secrecin vaginal con mal olor.  Tiene pensamientos de autolesionarse o de lesionar al beb. Si alguna vez siente que puede lastimarse o Physicist, medical a Economist, o tiene pensamientos de poner fin a su vida, busque ayuda de inmediato. Puede dirigirse al servicio de emergencias ms cercano o comunicarse con:  El servicio de emergencias de su localidad (911 en [place].).  Una lnea de asistencia al suicida y Visual merchandiser en crisis, como National Suicide Prevention Lifeline (Lnea Nacional de Prevencin del Suicidio), al [phone]. Est disponible las 24 horas del da. Resumen  El perodo de tiempo desde el parto y Pamela Mack 6 a 12 semanas despus del parto se denomina perodo posparto.  Retome sus actividades normales de a poco segn lo indicado por el mdico.  Concurra a todas las visitas de control para usted y Research scientist (physical sciences) se lo haya indicado el mdico. Esta informacin no tiene Theme park manager el consejo del mdico. Asegrese de hacerle al mdico cualquier pregunta que tenga. Document Revised: 04/14/2018 Document Reviewed: 04/14/2018 Elsevier Patient Education  2020 [employer]. Depresin posparto Postpartum  Baby Blues El perodo del posparto comienza inmediatamente despus del nacimiento de un beb. Este tiempo suele ser una poca de gran felicidad y mucho entusiasmo. Tambin es tiempo de muchos cambios en la vida de [place]. Independientemente de cuntas veces una madre d a luz, cada nio trae nuevos desafos a la familia, incluidas nuevas formas de relacionarse con los dems. Es frecuente tener sentimientos de entusiasmo y, a la vez, cambios desconcertantes en el estado de nimo, las emociones y los pensamientos. Es posible que se sienta feliz un minuto y [place] o estresada el siguiente. Estos sentimientos de tristeza suelen ocurrir inmediatamente despus del nacimiento y Electronics engineer en un lapso de una o [employer]. Esto se llama "depresin posparto". Cules son las causas? La depresin posparto no tiene una causa conocida. Probablemente sea consecuencia de una combinacin de factores. Sin embargo, se cree que los [employer] Corporation  hormonales ocurridos despus del nacimiento desencadenan algunos de los sntomas. Otros factores que pueden afectar estos cambios anmicos incluyen los siguientes:  Falta de sueo.  Sucesos estresantes de la vida, como la pobreza, el cuidado de un ser querido o la muerte de un ser querido.  Factores genticos. Cules son los signos o los sntomas? Los sntomas de esta afeccin incluyen los siguientes:  Cambios en el estado de nimo durante lapsos breves, como pasar de la felicidad extrema a la tristeza.  Falta de concentracin.  Dificultad para dormir.  Ataques de llanto y sensibilidad emocional.  Prdida del apetito.  Irritabilidad.  Ansiedad. Si los sntomas de la depresin posparto duran ms de 2semanas o [place], podra tener una forma ms grave de depresin posparto. Cmo se diagnostica? Esta afeccin se diagnostica en funcin de una evaluacin de los sntomas. No hay exmenes mdicos ni pruebas de laboratorio que Financial planner un  diagnstico, pero hay varios cuestionarios que el mdico podra usar para determinar si una mujer tiene depresin posparto o una forma ms grave de esta. Cmo se trata? No se requiere tratamiento para esta afeccin. Generalmente, la depresin posparto desaparece sin tratamiento en el trmino de 1 o 2semanas. A menudo, todo lo que se necesita es apoyo social. Se le recomendar que duerma y descanse lo suficiente. Siga estas indicaciones en su casa: Estilo de vida      Descanse todo lo posible. Tome una siesta cuando el beb duerma.  Haga actividad fsica habitualmente como se lo haya indicado el mdico. Para algunas mujeres, el yoga y las caminatas son tiles.  Consuma una dieta equilibrada y nutritiva. Esto incluye muchas frutas y verduras, cereales integrales y protenas magras.  Haga pequeas cosas que disfruta. Tome una taza de t, dese un bao de burbujas, lea su revista favorita o escuche su msica predilecta.  Evite el alcohol.  Pida ayuda con las tareas domsticas, la cocina, las compras, o las obligaciones diarias. No intente hacer todo usted misma. Considere la posibilidad de contratar a Neomia Dear doula para que la ayude y Psychiatric nurse. Se trata de una profesional que se especializa en asistir a nuevas madres.  Intente no hacer ningn cambio importante durante el embarazo ni inmediatamente despus del nacimiento. Esto podra sumar estrs. Instrucciones generales  Hable con personas allegadas sobre cmo se siente. Busque el apoyo de su pareja, sus familiares, sus amigos o de [employer] primerizas. Quiz Remus Blake a un grupo de apoyo.  Encuentre formas de lidiar con Development worker, community. Esto puede incluir lo siguiente: ? Escribir sus pensamientos y sentimientos en un diario. ? Pasar tiempo al [employer] Energy. ? Pasar tiempo con personas que la hagan rer.  Intente pensar positivamente. Piense en aquellas cosas por las que se siente agradecida.  Tome los medicamentos de venta libre y los  recetados solamente como se lo haya indicado el mdico.  Infrmele a su mdico sobre cualquier inquietud que tenga.  Concurrir a todas las visitas durante el posparto tal como se lo haya indicado el mdico. Esto es importante. Comunquese con un mdico si:  La depresin posparto no desaparece despus de 2semanas. Solicite ayuda de inmediato si:  Tiene pensamientos sobre [employer] Corporation vida (pensamientos suicidas).  Cree que podra lastimar al beb o a Economist.  Ve u oye cosas que no existen (alucinaciones). Resumen  Despus de dar a luz, es posible que se sienta feliz un minuto y [place] o estresada el siguiente. Los sentimientos de tristeza que ocurren inmediatamente despus de que el  beb nace y desaparecen luego de una o [employer] se llaman "depresin posparto".  Puede controlar la depresin posparto descansando lo suficiente, siguiendo una dieta saludable, realizando actividad fsica, pasando tiempo con personas que le den apoyo y encontrando modos de lidiar con Development worker, community.  Si los sentimientos de tristeza o estrs duran ms de 2semanas o perjudican el cuidado que le brinda al beb, hable con el mdico. Esto podra significar que tiene un caso ms grave de depresin posparto. Esta informacin no tiene Theme park manager el consejo del mdico. Asegrese de hacerle al mdico cualquier pregunta que tenga. Document Revised: 10/12/2017 Document Reviewed: 03/27/2017 Elsevier Patient Education  2020 Elsevier Inc. [place] materna Breastfeeding  Decidir amamantar es una de las mejores elecciones que puede hacer por usted y su beb. Un cambio en las hormonas durante el embarazo hace que las mamas produzcan leche materna en las glndulas productoras de [place]. Las hormonas impiden que la leche materna sea liberada antes del nacimiento del beb. Adems, impulsan el flujo de leche luego del nacimiento. Una vez que ha comenzado a Museum/gallery exhibitions officer, Conservation officer, nature beb, as Immunologist succin o Location manager, pueden estimular la liberacin de [place] de las glndulas productoras de [place]. Los beneficios de [employer] investigaciones demuestran que la lactancia materna ofrece muchos beneficios de salud para bebs y [place]. Adems, ofrece una forma gratuita y conveniente de Corporate treasurer al beb. Para el beb  La primera leche (calostro) ayuda a Careers information officer funcionamiento del aparato digestivo del beb.  Las clulas especiales de la leche (anticuerpos) ayudan a Artist las infecciones en el beb.  Los bebs que se alimentan con leche materna tambin tienen menos probabilidades de tener asma, alergias, obesidad o diabetes de tipo 2. Adems, tienen menor riesgo de sufrir el sndrome de muerte sbita del lactante (SMSL).  Los nutrientes de la [place] materna son mejores para Patent examiner las necesidades del beb en comparacin con la [employer].  La leche materna mejora el desarrollo cerebral del beb. Para usted  La lactancia materna favorece el desarrollo de un vnculo muy especial entre la madre y el beb.  Es conveniente. La leche materna es econmica y siempre est disponible a la Human resources officer.  La lactancia materna ayuda a quemar caloras. [place] a perder el peso ganado durante el [place].  Hace que el tero vuelva al tamao que tena antes del embarazo ms rpido. Adems, disminuye el sangrado (loquios) despus del parto.  La lactancia materna contribuye a reducir Nurse, adult de tener diabetes de tipo 2, osteoporosis, artritis reumatoide, enfermedades cardiovasculares y cncer de mama, ovario, tero y endometrio en el futuro. Informacin bsica sobre la lactancia Comienzo de la lactancia  Encuentre un lugar cmodo para sentarse o Teacher, music, con un buen respaldo para el cuello y la espalda.  Coloque una almohada o una manta enrollada debajo del beb para acomodarlo a la altura de la mama (si est sentada). Las almohadas para Museum/gallery exhibitions officer se han diseado especialmente a fin de  servir de apoyo para los brazos y el beb [employer].  Asegrese de que la barriga del beb (abdomen) est frente a la suya.  [place] suavemente la mama. Con las yemas de los dedos, [employer] Media bordes exteriores de la mama hacia adentro, en direccin al pezn. Esto estimula el flujo de [address]. Si la [employer], es posible que deba Educational psychologist con este movimiento durante la Market researcher.  Sostenga la mama con 4 dedos por debajo y Multimedia programmer por [place] del  pezn (forme la letra "C" con la mano). Asegrese de que los dedos se encuentren lejos del pezn y de la boca del beb.  Empuje suavemente los labios del beb con el pezn o con el dedo.  Cuando la boca del beb se abra lo suficiente, acrquelo rpidamente a la mama e introduzca todo el pezn y la arola, tanto como sea posible, dentro de la boca del beb. La arola es la zona de color que rodea al pezn. ? Debe haber ms arola visible por arriba del labio superior del beb que por debajo del labio inferior. ? Los labios del beb deben estar abiertos y extendidos hacia afuera (evertidos) para asegurar que el beb se prenda de forma adecuada y cmoda. ? La lengua del beb debe estar entre la enca inferior y Educational psychologist.  Asegrese de que la boca del beb est en la posicin correcta alrededor del pezn (prendido). Los labios del beb deben crear un sello sobre la mama y estar doblados hacia afuera (invertidos).  Es comn que el beb succione durante 2 a 3 minutos para que comience el flujo de [place]. Cmo debe prenderse Es muy importante que le ensee al beb cmo prenderse adecuadamente a la mama. Si el beb no se prende adecuadamente, puede causar [employer] Stores, reducir la produccin de [place] materna y Radio producer que el beb tenga un escaso aumento de [place]. Adems, si el beb no se prende adecuadamente al pezn, puede tragar aire durante la alimentacin. Esto puede causarle molestias al beb. Hacer eructar al beb al [place] de  mama puede ayudarlo a liberar el aire. Sin embargo, ensearle al beb cmo prenderse a la mama adecuadamente es la mejor manera de evitar que se sienta molesto por tragar Oceanographer se alimenta. Signos de que el beb se ha prendido adecuadamente al pezn  Tironea o succiona de modo silencioso, sin Publishing rights manager. Los labios del beb deben estar extendidos hacia afuera (evertidos).  Se escucha que traga cada 3 o 4 succiones una vez que la [employer] ha comenzado a Radiographer, therapeutic (despus de que se produzca el reflejo de eyeccin de la [place]).  Hay movimientos musculares por arriba y por delante de sus odos al Printmaker. Signos de que el beb no se ha prendido Audiological scientist al pezn  Hace ruidos de succin o de chasquido mientras se Tree surgeon.  Siente dolor en los pezones. Si cree que el beb no se prendi correctamente, deslice el dedo en la comisura de la boca y A[employer] Corporation las encas del beb para interrumpir la succin. Intente volver a comenzar a Museum/gallery exhibitions officer. Signos de Market researcher materna exitosa Signos del beb  El beb disminuir gradualmente el nmero de succiones o dejar de succionar por completo.  El beb se quedar dormido.  El cuerpo del beb se relajar.  El beb retendr Neomia Dear pequea cantidad de [facility] boca.  El beb se desprender solo del [place]. Signos que presenta usted  Las mamas han aumentado la firmeza, el peso y el tamao 1 a 3 horas despus de Museum/gallery exhibitions officer.  Estn ms blandas inmediatamente despus de amamantar.  Se producen un aumento del volumen de [place] y un cambio en su consistencia y color hacia el quinto da de Market researcher.  Los pezones no duelen, no estn agrietados ni sangran. Signos de que su beb recibe la cantidad de leche suficiente  Mojar por lo menos 1 o 2paales durante las primeras 24horas despus del nacimiento.  Mojar por lo menos 5 o 6paales cada  24horas durante la primera semana despus del nacimiento. La orina debe ser clara o de color  amarillo plido a los 5das de vida.  Mojar entre 6 y 8paales cada 24horas a medida que el beb sigue creciendo y desarrollndose.  Defeca por lo menos 3 veces en 24 horas a los 5 [address] de [address] Las heces deben ser blandas y Armed forces operational officer.  Defeca por lo menos 3 veces en 24 horas a los [address] de [address] Las heces deben ser grumosas y Armed forces operational officer.  No registra una prdida de peso mayor al 10% del peso al nacer durante los primeros 3 [address] de [place].  Aumenta de peso un promedio de 4 a 7onzas (113 a 198g) por semana despus de los 4 [address] de vida.  Aumenta de [place], [place], de [place] uniforme a Glass blower/designer de los 5 [address] de vida, sin Passenger transport manager prdida de peso despus de las 2semanas de vida. Despus de alimentarse, es posible que el beb regurgite una pequea cantidad de [place]. Esto es normal. Frecuencia y duracin de la lactancia El amamantamiento frecuente la ayudar a producir ms [place] y puede prevenir dolores en los pezones y las mamas extremadamente llenas (congestin [place]). Alimente al beb cuando muestre signos de hambre o si siente la necesidad de reducir la congestin de las [place]. Esto se denomina "lactancia a demanda". Las seales de que el beb tiene hambre incluyen las siguientes:  Aumento del [place] de [place], [place] o inquietud.  Mueve la cabeza de un lado a otro.  Abre la boca cuando se le toca la mejilla o la comisura de la boca (reflejo de bsqueda).  Aumenta las vocalizaciones, tales como sonidos de succin, se relame los labios, emite arrullos, suspiros o chirridos.  Mueve la [employer] boca y se chupa los dedos o las manos.  Est molesto o llora. Evite el uso del chupete en las primeras 4 a 6 semanas despus del nacimiento del beb. Despus de este perodo, podr usar un chupete. Las investigaciones demostraron que el uso del chupete durante Financial risk analyst ao de vida del beb disminuye el riesgo de tener el sndrome de muerte sbita del lactante (SMSL). Permita  que el nio se alimente en cada mama todo lo que desee. Cuando el beb se desprende o se queda dormido mientras se est alimentando de la primera mama, ofrzcale la segunda. Debido a que, con frecuencia, los recin nacidos estn somnolientos las primeras semanas de vida, es posible que deba despertar al beb para alimentarlo. Los horarios de Acupuncturist de un beb a otro. Sin embargo, las siguientes reglas pueden servir como gua para ayudarla a Lawyer que el beb se alimenta adecuadamente:  Se puede amamantar a los recin nacidos (bebs de 4 semanas o menos de vida) cada 1 a 3 horas.  No deben transcurrir ms de 3 horas durante el da o 5 horas durante la noche sin que se amamante a los recin nacidos.  Debe amamantar al beb un mnimo de 8 veces en un perodo de 24 horas. Extraccin de [employer] Companies extraccin y Contractor de la leche materna le permiten asegurarse de que el beb se alimente exclusivamente de su leche materna, aun en momentos en los que no puede Museum/gallery exhibitions officer. Esto tiene especial importancia si debe regresar al Aleen Campi en el perodo en que an est amamantando o si no puede estar presente en los momentos en que el beb debe alimentarse. Su asesor en lactancia puede ayudarla a Clinical research associate un mtodo  de extraccin que funcione mejor para usted y Programmer, systemsorientarla sobre cunto tiempo es seguro almacenar Fortunaleche materna. Cmo cuidar las mamas durante la lactancia Los pezones pueden secarse, Lobbyistagrietarse y doler durante la Market researcherlactancia. Las siguientes recomendaciones pueden ayudarla a Pharmacologistmantener las TEPPCO Partnersmamas humectadas y sanas:  Careers information officervite usar jabn en los pezones.  Use un sostn de soporte diseado especialmente para la lactancia materna. Evite usar sostenes con aro o sostenes muy ajustados (sostenes deportivos).  Seque al aire sus pezones durante 3 a 4minutos despus de amamantar al beb.  Utilice solo apsitos de Haematologistalgodn en el sostn para Environmental health practitionerabsorber las prdidas de [place]leche. La prdida de  un poco de Public Service Enterprise Groupleche materna entre las tomas es normal.  Utilice lanolina sobre los pezones luego de Museum/gallery exhibitions officeramamantar. La lanolina ayuda a mantener la humedad normal de la piel. La lanolina pura no es perjudicial (no es txica) para el beb. Adems, puede extraer Beazer Homesmanualmente algunas gotas de [place]leche materna y Engineer, maintenance (IT)masajear suavemente esa ToysRusleche sobre los pezones para que la [place]he se seque al aire. Durante las primeras semanas despus del nacimiento, algunas mujeres experimentan Elkinscongestin mamaria. La congestin [place] Corporationmamaria puede hacer que sienta las mamas pesadas, calientes y sensibles al tacto. El pico de la congestin mamaria ocurre en el plazo de los 3 a 5 das despus del Taylors Islandparto. Las siguientes recomendaciones pueden ayudarla a Paramedicaliviar la congestin mamaria:  Vace por completo las mamas al QUALCOMMamamantar o Environmental health practitionerextraer leche. Puede aplicar calor hmedo en las mamas (en la ducha o con toallas hmedas para manos) antes de Museum/gallery exhibitions officeramamantar o extraer WPS Resourcesleche. Esto aumenta la circulacin y [place] and the Grenadinesayuda a que la [place]leche fluya. Si el beb no vaca por completo las [address]amas cuando lo [address] Aveamamanta, extraiga la [place]leche restante despus de que haya finalizado.  Aplique compresas de hielo Yahoo! Incsobre las mamas inmediatamente despus de Museum/gallery exhibitions officeramamantar o extraer [place]eche, a menos que le resulte demasiado incmodo. Haga lo siguiente: ? Ponga el hielo en una bolsa plstica. ? Coloque una FirstEnergy Corptoalla entre la piel y la bolsa de hielo. ? Coloque el hielo durante 20minutos, 2 o 3veces por da.  Asegrese de que el beb est prendido y se encuentre en la posicin correcta mientras lo alimenta. Si la congestin mamaria persiste luego de 48 horas o despus de seguir estas recomendaciones, comunquese con su mdico o un Holiday representativeasesor en lactancia. Recomendaciones de salud general durante la lactancia  Consuma 3 comidas y 3 colaciones saludables todos los Goughdas. Las M.D.C. Holdingsmadres bien alimentadas que amamantan necesitan entre 450 y 500 caloras adicionales por Futures traderda. Puede cumplir con este requisito al aumentar  la cantidad de una dieta equilibrada que realice.  Beba suficiente agua para mantener la orina clara o de color amarillo plido.  Descanse con frecuencia, reljese y siga tomando sus vitaminas prenatales para prevenir la fatiga, el estrs y los niveles bajos de vitaminas y The Timken Companyminerales en el cuerpo (deficiencias de nutrientes).  No consuma ningn producto que contenga nicotina o tabaco, como cigarrillos y Administrator, Civil Servicecigarrillos electrnicos. El beb puede verse afectado por las sustancias qumicas de los cigarrillos que pasan a la Damascusleche materna y por la exposicin al humo ambiental del tabaco. Si necesita ayuda para dejar de fumar, consulte al mdico.  Evite el consumo de alcohol.  No consuma drogas ilegales o marihuana.  Antes de Dietitianusar cualquier medicamento, hable con el mdico. Estos incluyen medicamentos recetados y de Spencerventa libre, como tambin vitaminas y suplementos a base de hierbas. Algunos medicamentos, que pueden ser perjudiciales para el beb, pueden pasar a travs de la Colgate Palmoliveleche materna.  Puede quedar embarazada durante la lactancia. Si se desea un mtodo anticonceptivo, consulte al mdico sobre cules son las opciones seguras durante la Market researcher. Dnde encontrar ms informacin: Liga internacional [place]eche: [email or web address]/. Comunquese con un mdico si:  Siente que quiere dejar de Museum/gallery exhibitions officer o se siente frustrada con la lactancia.  Sus pezones estn agrietados o Water quality scientist.  Sus mamas estn irritadas, sensibles o calientes.  Tiene los siguientes sntomas: ? Dolor en las mamas o en los pezones. ? Un rea hinchada en cualquiera de las mamas. ? Grant Ruts o escalofros. ? Nuseas o vmitos. ? Drenaje de otro lquido distinto de la WPS Resources materna desde los pezones.  Sus mamas no se llenan antes de Museum/gallery exhibitions officer al beb para el quinto da despus del [place].  Se siente triste y deprimida.  El beb: ? Est demasiado somnoliento como para comer bien. ? Tiene problemas para dormir. ? Tiene ms de 1 semana  de vida y [employer] de 6 paales en un periodo de 24 horas. ? No ha aumentado de [place] a los [address] de [address]  El beb defeca menos de 3 veces en 24 horas.  La piel del beb o las partes blancas de los ojos se vuelven amarillentas. Solicite ayuda de inmediato si:  El beb est muy cansado Retail buyer) y no se quiere despertar para comer.  Le sube la fiebre sin causa. Resumen  La lactancia materna ofrece muchos beneficios de salud para bebs y [place].  Intente amamantar a su beb cuando muestre signos tempranos de hambre.  Haga cosquillas o empuje suavemente los labios del beb con el dedo o el pezn para lograr que el beb abra la boca. Acerque el beb a la mama. Asegrese de que la mayor parte de la arola se encuentre dentro de la boca del beb. Ofrzcale una mama y haga eructar al beb antes de pasar a la otra.  Hable con su mdico o asesor en lactancia si tiene dudas o problemas con la lactancia. Esta informacin no tiene Theme park manager el consejo del mdico. Asegrese de hacerle al mdico cualquier pregunta que tenga. Document Revised: 10/08/2017 Document Reviewed: 11/03/2016 Elsevier Patient Education  2020 [employer].

## 2020-04-19 NOTE — Addendum Note (Signed)
Addended by: Nylee Barbuto on: 04/19/2020 02:38 PM ° ° Modules accepted: Orders ° °

## 2021-08-23 ENCOUNTER — Inpatient Hospital Stay
Admission: EM | Admit: 2021-08-23 | Discharge: 2021-08-24 | DRG: 603 | Disposition: A | Discharge status: Home / Self Care | Payer: Self-pay | Attending: Internal Medicine | Admitting: Internal Medicine

## 2021-08-23 ENCOUNTER — Other Ambulatory Visit: Payer: Self-pay

## 2021-08-23 ENCOUNTER — Encounter: Payer: Self-pay | Admitting: Emergency Medicine

## 2021-08-23 ENCOUNTER — Emergency Department: Payer: Self-pay

## 2021-08-23 DIAGNOSIS — R1310 Dysphagia, unspecified: Secondary | ICD-10-CM | POA: Diagnosis present

## 2021-08-23 DIAGNOSIS — Z20822 Contact with and (suspected) exposure to covid-19: Secondary | ICD-10-CM | POA: Diagnosis present

## 2021-08-23 DIAGNOSIS — L03211 Cellulitis of face: Principal | ICD-10-CM | POA: Diagnosis present

## 2021-08-23 DIAGNOSIS — K047 Periapical abscess without sinus: Secondary | ICD-10-CM | POA: Diagnosis present

## 2021-08-23 DIAGNOSIS — Z91040 Latex allergy status: Secondary | ICD-10-CM

## 2021-08-23 DIAGNOSIS — Z6841 Body Mass Index (BMI) 40.0 and over, adult: Secondary | ICD-10-CM

## 2021-08-23 DIAGNOSIS — M60009 Infective myositis, unspecified site: Secondary | ICD-10-CM

## 2021-08-23 DIAGNOSIS — Z833 Family history of diabetes mellitus: Secondary | ICD-10-CM

## 2021-08-23 DIAGNOSIS — M609 Myositis, unspecified: Secondary | ICD-10-CM | POA: Diagnosis present

## 2021-08-23 LAB — SEDIMENTATION RATE: Sed Rate: 43 mm/hr — ABNORMAL HIGH (ref 0–20)

## 2021-08-23 LAB — CBC WITH DIFFERENTIAL/PLATELET
Abs Immature Granulocytes: 0.02 10*3/uL (ref 0.00–0.07)
Basophils Absolute: 0 10*3/uL (ref 0.0–0.1)
Basophils Relative: 0 %
Eosinophils Absolute: 0.2 10*3/uL (ref 0.0–0.5)
Eosinophils Relative: 1 %
HCT: 40.3 % (ref 36.0–46.0)
Hemoglobin: 13.6 g/dL (ref 12.0–15.0)
Immature Granulocytes: 0 %
Lymphocytes Relative: 15 %
Lymphs Abs: 1.6 10*3/uL (ref 0.7–4.0)
MCH: 29.2 pg (ref 26.0–34.0)
MCHC: 33.7 g/dL (ref 30.0–36.0)
MCV: 86.7 fL (ref 80.0–100.0)
Monocytes Absolute: 0.9 10*3/uL (ref 0.1–1.0)
Monocytes Relative: 9 %
Neutro Abs: 8 10*3/uL — ABNORMAL HIGH (ref 1.7–7.7)
Neutrophils Relative %: 75 %
Platelets: 269 10*3/uL (ref 150–400)
RBC: 4.65 MIL/uL (ref 3.87–5.11)
RDW: 12.4 % (ref 11.5–15.5)
WBC: 10.7 10*3/uL — ABNORMAL HIGH (ref 4.0–10.5)
nRBC: 0 % (ref 0.0–0.2)

## 2021-08-23 LAB — LACTIC ACID, PLASMA
Lactic Acid, Venous: 0.9 mmol/L (ref 0.5–1.9)
Lactic Acid, Venous: 1.5 mmol/L (ref 0.5–1.9)

## 2021-08-23 LAB — RESP PANEL BY RT-PCR (FLU A&B, COVID) ARPGX2
Influenza A by PCR: NEGATIVE
Influenza B by PCR: NEGATIVE
SARS Coronavirus 2 by RT PCR: NEGATIVE

## 2021-08-23 LAB — HCG, QUANTITATIVE, PREGNANCY: hCG, Beta Chain, Quant, S: <1 m[IU]/mL (ref <5)

## 2021-08-23 LAB — BASIC METABOLIC PANEL
Anion gap: 5 (ref 5–15)
BUN: 9 mg/dL (ref 6–20)
CO2: 25 mmol/L (ref 22–32)
Calcium: 9 mg/dL (ref 8.9–10.3)
Chloride: 105 mmol/L (ref 98–111)
Creatinine, Ser: 0.61 mg/dL (ref 0.44–1.00)
GFR, Estimated: >60 mL/min (ref >60)
Glucose, Bld: 116 mg/dL — ABNORMAL HIGH (ref 70–99)
Potassium: 3.5 mmol/L (ref 3.5–5.1)
Sodium: 135 mmol/L (ref 135–145)

## 2021-08-23 LAB — PROTIME-INR
INR: 1.1 (ref 0.8–1.2)
Prothrombin Time: 14.2 seconds (ref 11.4–15.2)

## 2021-08-23 LAB — PROCALCITONIN: Procalcitonin: <0.1 ng/mL

## 2021-08-23 LAB — C-REACTIVE PROTEIN: CRP: 12.6 mg/dL — ABNORMAL HIGH (ref <1.0)

## 2021-08-23 LAB — POC URINE PREG, ED: Preg Test, Ur: NEGATIVE

## 2021-08-23 LAB — APTT: aPTT: 34 seconds (ref 24–36)

## 2021-08-23 LAB — HIV ANTIBODY (ROUTINE TESTING W REFLEX): HIV Screen 4th Generation wRfx: NONREACTIVE

## 2021-08-23 IMAGING — CT CT NECK W/ CM
4 of 5 series · 13 of 35 positions shown, 15 images · IV contrast (agent unspecified)
Comparison: None.

CLINICAL DATA: Soft tissue swelling with infection suspected. Molar
removed 4 days ago on the left.

EXAM:
CT NECK WITH CONTRAST
TECHNIQUE: Multidetector CT imaging of the neck was performed using the
standard protocol following the bolus administration of intravenous
contrast.

[Series 4: axial bone · axial · 0.54mm/px · z∈[-230,-106]mm · 3 of 125 slices shown, 4 images]
[im 32/125  soft-tissue]
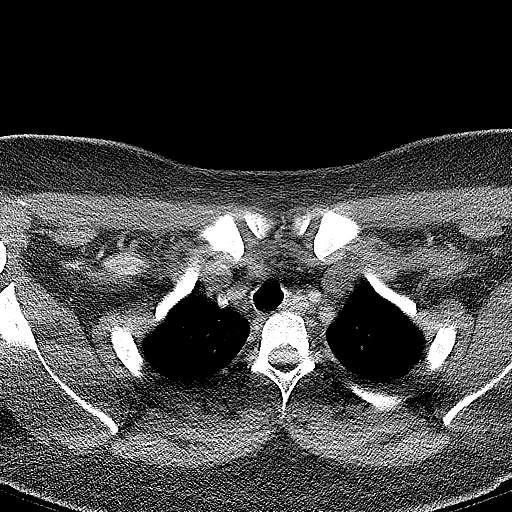
[im 32/125  bone]
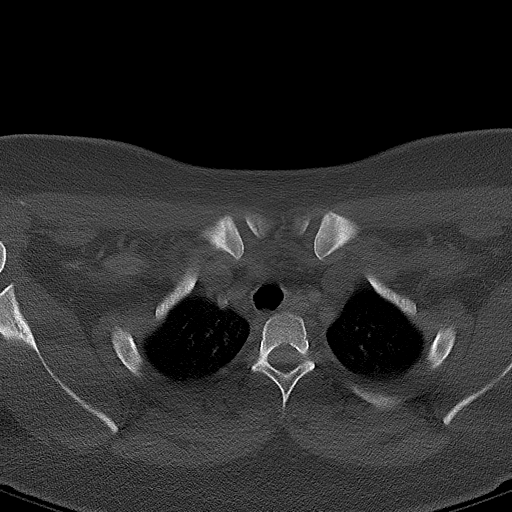
[im 63/125  bone]
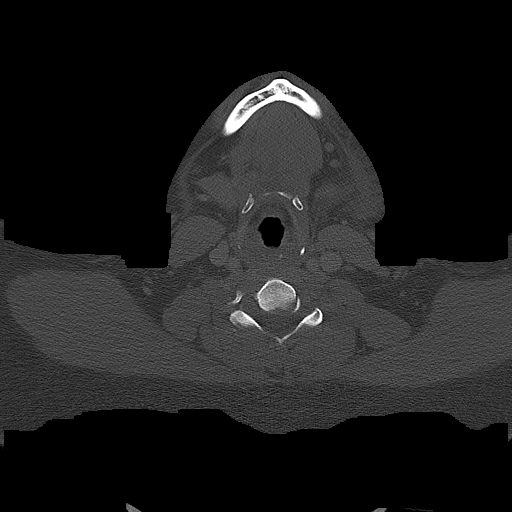
[im 94/125  bone]
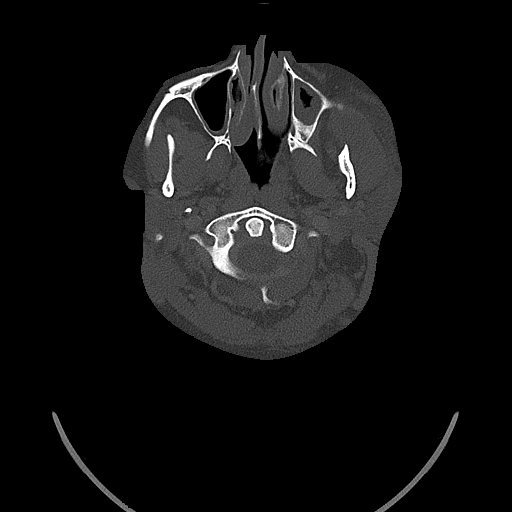

[Series 5: sag neck · sagittal · 0.43mm/px · 5 of 99 slices shown, 6 images]
[im 33/99  bone]
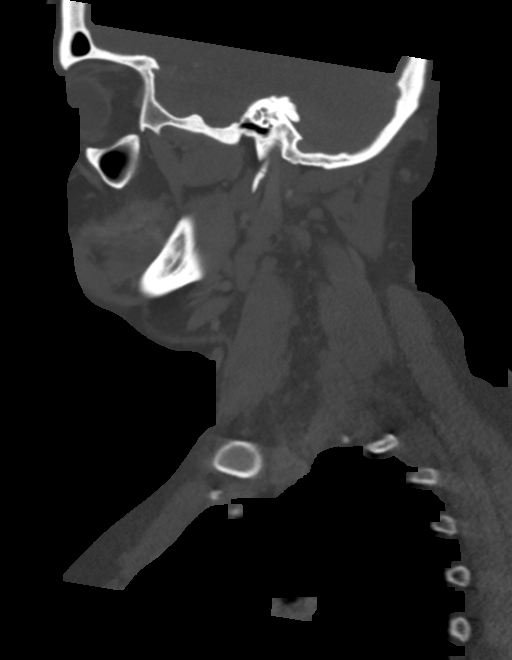
[im 41/99  bone]
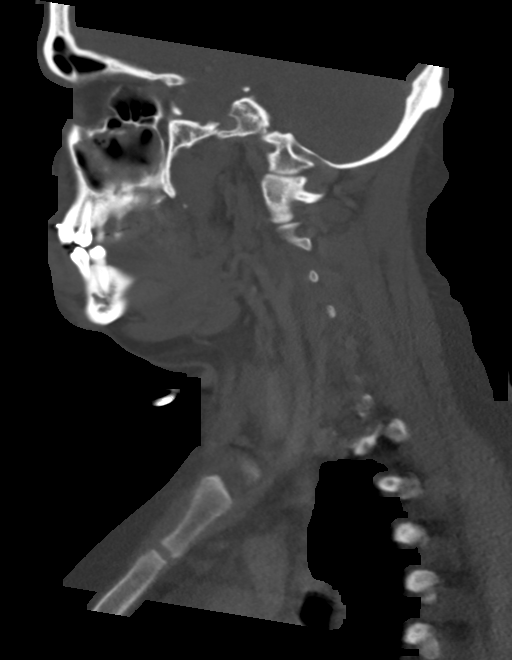
[im 50/99  soft-tissue]
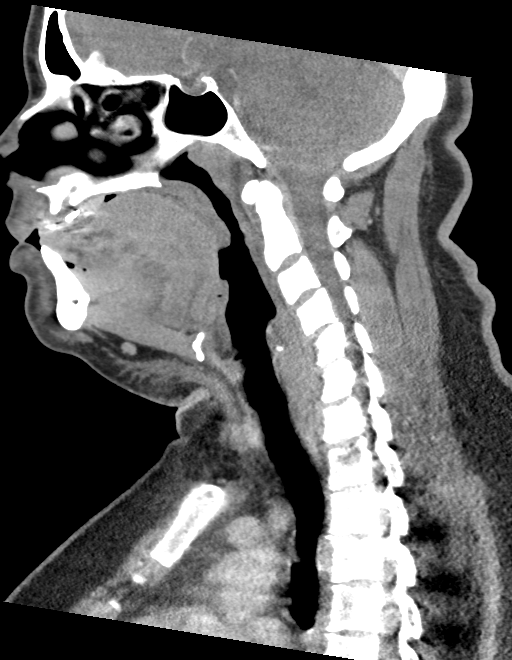
[im 50/99  bone]
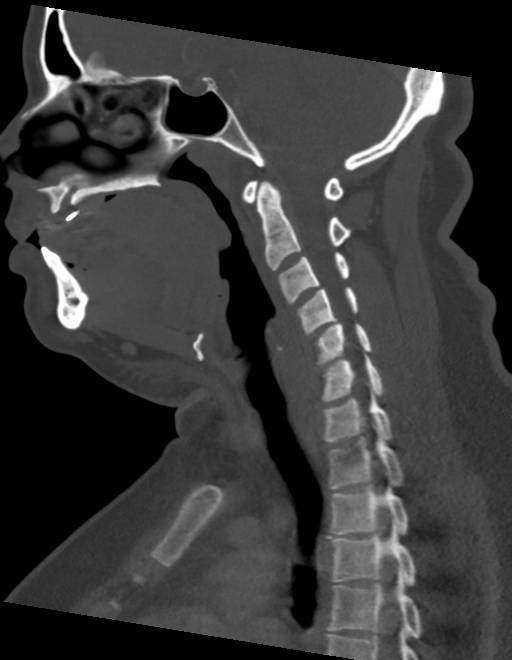
[im 58/99  bone]
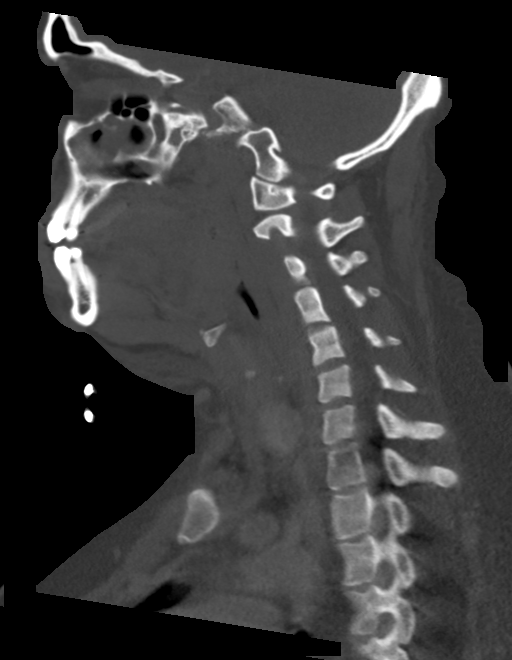
[im 66/99  bone]
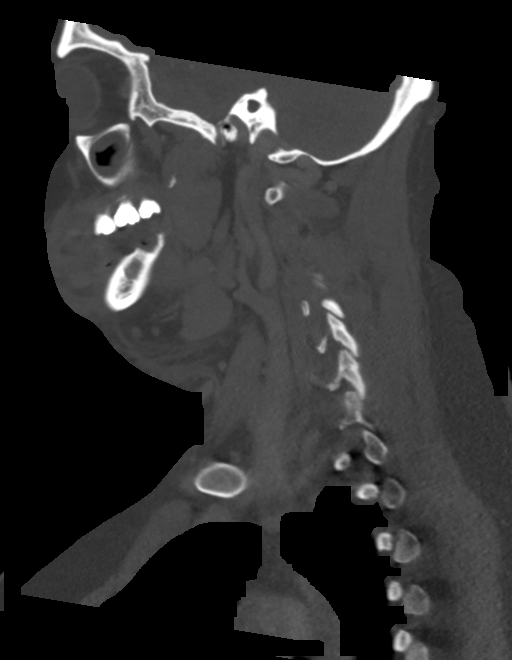

[Series 6: cor neck · coronal · 0.36mm/px · 3 of 115 slices shown]
[im 23/115  bone]
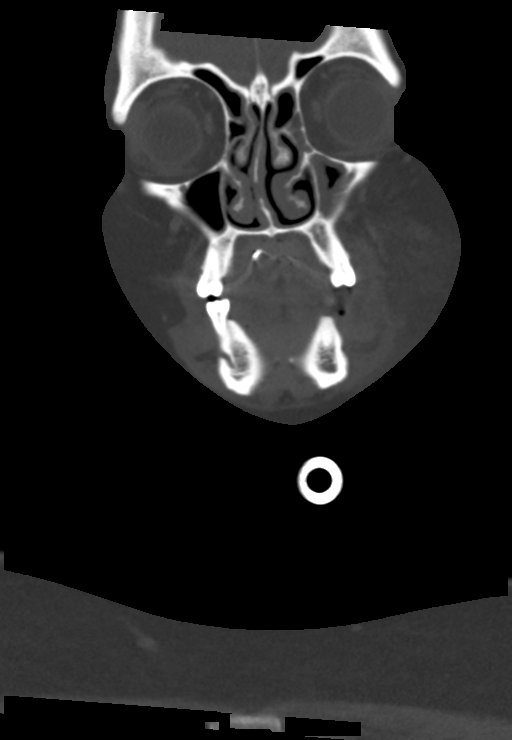
[im 46/115  bone]
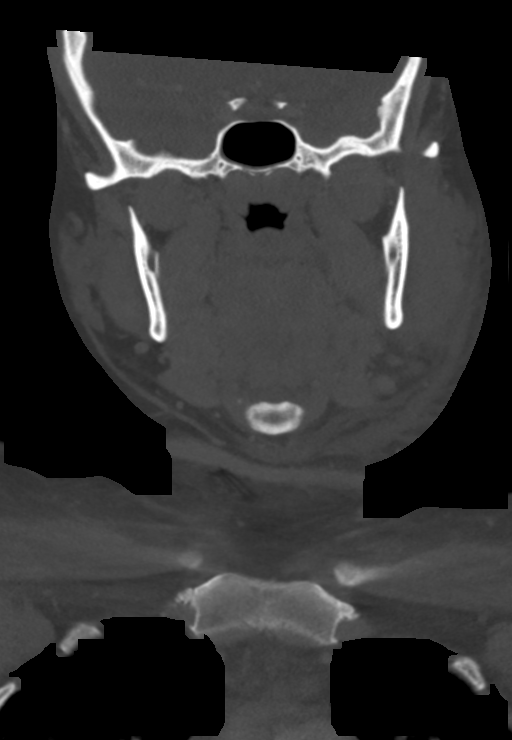
[im 69/115  bone]
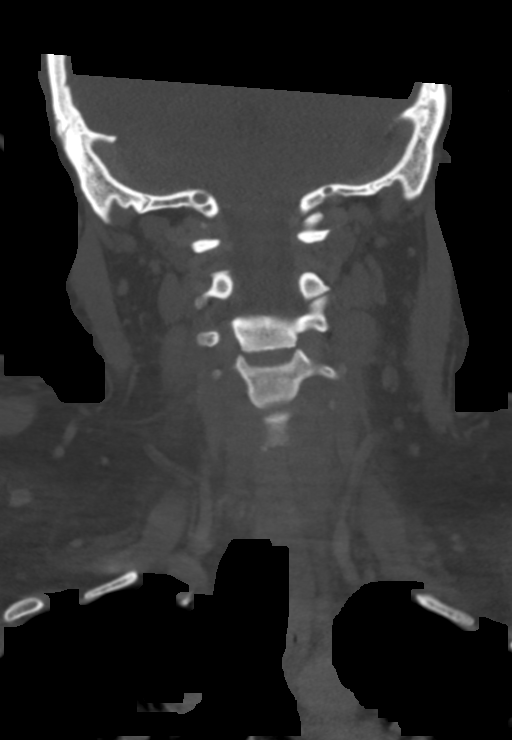

[Series 7: orthogonal (person_name) · axial · 0.35mm/px · z∈[-238,-177]mm · 2 of 126 slices shown]
[im 32/126  bone]
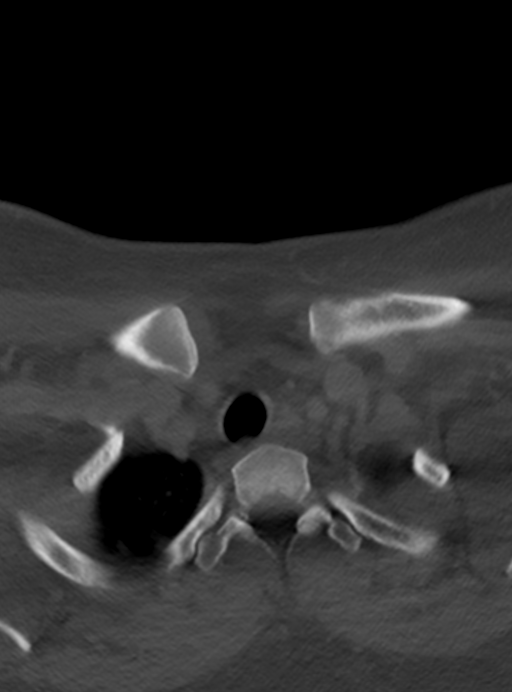
[im 63/126  bone]
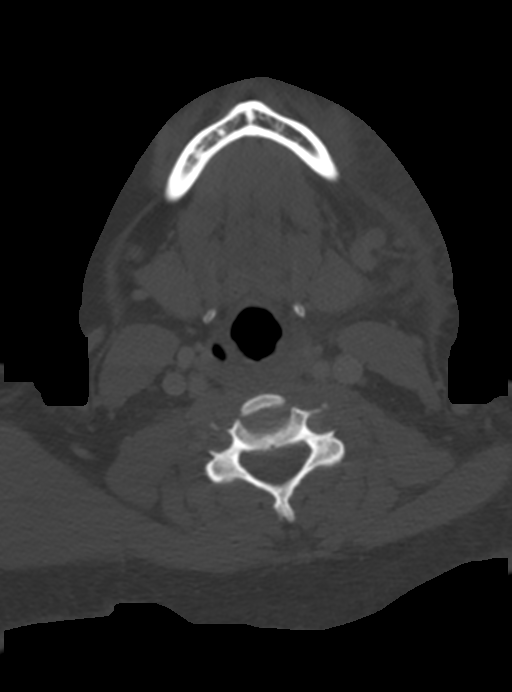

[13 of 35 positions shown; findings below may reference images not displayed]

RADIATION DOSE REDUCTION: This exam was performed according to the
departmental dose-optimization program which includes automated
exposure control, adjustment of the mA and/or kV according to
patient size and/or use of iterative reconstruction technique.

CONTRAST:  75mL OMNIPAQUE IOHEXOL 300 MG/ML  SOLN
FINDINGS: Pharynx and larynx: No evidence of mass or inflammation.

Salivary glands: No inflammation, mass, or stone.

Thyroid: Normal.

Lymph nodes: Expected enlargement of cervical lymph nodes asymmetric
to the left.

Vascular: Negative.  No venous thrombosis

Limited intracranial: Negative

Visualized orbits: Negative

Mastoids and visualized paranasal sinuses: Patchy mucosal thickening
asymmetric to the left maxillary sinus. No sinus fluid levels.
Partial bilateral mastoid opacification.

Skeleton: Empty left lower mandibular alveolus with regional fat and
masseter inflammation/swelling. Ovoid Hypoenhancement in the
adjacent masseter, measuring 16 mm diameter on axial images. No
drainable collection. No floor of mouth mass effect.

Upper chest: Negative
IMPRESSION: Cellulitis and masseter myositis adjacent to the recent left-sided
tooth extraction. There is 16 mm area of hypoenhancement in the left
masseter most consistent with pre-abscess. No drainable collection
or floor of mouth swelling.

## 2021-08-23 MED ORDER — DEXAMETHASONE SODIUM PHOSPHATE 10 MG/ML IJ SOLN
10.0000 mg | Freq: Once | INTRAMUSCULAR | Status: AC
  Administered 2021-08-23: 10 mg via INTRAVENOUS
  Filled 2021-08-23: qty 1

## 2021-08-23 MED ORDER — IOHEXOL 300 MG/ML  SOLN
75.0000 mL | Freq: Once | INTRAMUSCULAR | Status: AC | PRN
  Administered 2021-08-23: 75 mL via INTRAVENOUS

## 2021-08-23 MED ORDER — ONDANSETRON HCL 4 MG/2ML IJ SOLN: 4.0000 mg | Freq: Three times a day (TID) | INTRAMUSCULAR | Status: DC | PRN

## 2021-08-23 MED ORDER — HEPARIN SODIUM (PORCINE) 5000 UNIT/ML IJ SOLN
5000.0000 [IU] | Freq: Three times a day (TID) | INTRAMUSCULAR | Status: DC
  Administered 2021-08-23 – 2021-08-24 (×3): 5000 [IU] via SUBCUTANEOUS
  Filled 2021-08-23 (×3): qty 1

## 2021-08-23 MED ORDER — DEXAMETHASONE SODIUM PHOSPHATE 10 MG/ML IJ SOLN
10.0000 mg | Freq: Three times a day (TID) | INTRAMUSCULAR | Status: DC
  Administered 2021-08-23 – 2021-08-24 (×4): 10 mg via INTRAVENOUS
  Filled 2021-08-23 (×4): qty 1

## 2021-08-23 MED ORDER — SODIUM CHLORIDE 0.9 % IV BOLUS
1000.0000 mL | Freq: Once | INTRAVENOUS | Status: AC
  Administered 2021-08-23: 1000 mL via INTRAVENOUS

## 2021-08-23 MED ORDER — SODIUM CHLORIDE 0.9 % IV SOLN
3.0000 g | Freq: Once | INTRAVENOUS | Status: AC
  Administered 2021-08-23: 3 g via INTRAVENOUS
  Filled 2021-08-23: qty 8

## 2021-08-23 MED ORDER — MORPHINE SULFATE (PF) 2 MG/ML IV SOLN: 2.0000 mg | INTRAVENOUS | Status: DC | PRN

## 2021-08-23 MED ORDER — SODIUM CHLORIDE 0.9 % IV SOLN
3.0000 g | Freq: Four times a day (QID) | INTRAVENOUS | Status: DC
  Administered 2021-08-23 – 2021-08-24 (×5): 3 g via INTRAVENOUS
  Filled 2021-08-23: qty 8
  Filled 2021-08-23: qty 3
  Filled 2021-08-23 (×2): qty 8
  Filled 2021-08-23 (×3): qty 3

## 2021-08-23 MED ORDER — MORPHINE SULFATE (PF) 4 MG/ML IV SOLN
4.0000 mg | Freq: Once | INTRAVENOUS | Status: AC
  Administered 2021-08-23: 4 mg via INTRAVENOUS
  Filled 2021-08-23: qty 1

## 2021-08-23 MED ORDER — ACETAMINOPHEN 325 MG PO TABS: 650.0000 mg | ORAL_TABLET | Freq: Four times a day (QID) | ORAL | Status: DC | PRN

## 2021-08-23 MED ORDER — OXYCODONE-ACETAMINOPHEN 5-325 MG PO TABS
1.0000 | ORAL_TABLET | ORAL | Status: DC | PRN
  Administered 2021-08-23: 21:00:00 1 via ORAL
  Filled 2021-08-23: qty 1

## 2021-08-23 NOTE — Assessment & Plan Note (Signed)
CT scan showed cellulitis and myositis of masseter muscle, with 1.6 cm of pre-abscess, but no drainable.  Dr. Tami Ribas of ENT is consulted by EDP, who reviewed CT scan, did not think patient has drainable abscess.  He recommended to treat the patient with Unasyn 3 g every 6 hours and Decadron 10 mg IV every 8 hours. Pt does not meet criteria for sepsis currently.  - Admitted to MedSurg bed as inpatient - Unasyn 3 g every 6 hours and Decadron 10 mg IV every 8 hours. - PRN Zofran for nausea, morphine and Percocet for pain - Blood cultures x 2  - ESR and CRP - will get Procalcitonin and trend lactic acid levels  - IVF: 2L of NS bolus

## 2021-08-23 NOTE — H&P (Signed)
History and Physical    Patient: Pamela Mack EPP:[id number] DOB: February 25, 1996 DOA: 08/23/2021 DOS: the patient was seen and examined on 08/23/2021 PCP: Patient, No Pcp Per (Inactive)  Patient coming from: Home  Chief Complaint: left facial pain and swelling  HPI: Pamela Mack is a 26 y.o. female with medical history significant of preeclampsia, obesity, who presents with left facial pain and swelling.  Per patient and her sister at the bedside, pt had one of left lower molar teeth removed 4 days ago. She developed pain and swelling in left face and left jaw, which has been progressively worsening.  The pain is constant, sharp, severe, radiating to the left neck and left ear area.  Denies fever and chills.  She has trismus, difficult to open her mouth, difficult to eat or drink liquid.  No voice change.  No difficulty swallowing.  Patient reports chest pressure, no active chest pain, cough, shortness breath.  She has nausea, but no vomiting, diarrhea or abdominal pain.   Data review and ED course: I have personally reviewed labs and imaging studies.  WBC 10.7, negative COVID PCR, negative pregnancy test, GFR > 60, Bp 92/56, heart rate 92, RR 18, oxygen saturation 98% on room air.  Patient is admitted to [address] bed as inpatient.  Dr. Tami Ribas of ENT is consulted by EDP.  CT-neck soft tissue: Cellulitis and masseter myositis adjacent to the recent left-sided tooth extraction. There is 16 mm area of hypoenhancement in the left masseter most consistent with pre-abscess. No drainable collection or floor of mouth swelling.  EKG: Not done yet in ED, will get one  Review of Systems:   General: no fevers, chills, no body weight gain, has fatigue HEENT: no blurry vision, hearing changes or sore throat. Has pain and swelling in left face and left jaw Respiratory: no dyspnea, coughing, wheezing CV: no chest pain, no palpitations GI: has nausea, no vomiting, abdominal pain, diarrhea,  constipation GU: no dysuria, burning on urination, increased urinary frequency, hematuria  Ext: no leg edema Neuro: no unilateral weakness, numbness, or tingling, no vision change or hearing loss Skin: no rash, no skin tear. MSK: No muscle spasm, no deformity, no limitation of range of movement in spin Heme: No easy bruising.  Travel history: No recent long distant travel.  Past Medical History:  Diagnosis Date   Anemia affecting pregnancy in third trimester 02/09/2020   History of pre-eclampsia    Pre-eclampsia affecting childbirth    Pregnancy induced hypertension    Past Surgical History:  Procedure Laterality Date   CESAREAN SECTION  01/17/2013   [place]   CESAREAN SECTION  07/31/2016   scheduled   CESAREAN SECTION WITH BILATERAL TUBAL LIGATION Bilateral 03/19/2020   Procedure: CESAREAN SECTION WITH BILATERAL TUBAL LIGATION;  Surgeon: Benjaman Kindler, MD;  Location: [facility] ORS;  Service: Obstetrics;  Laterality: Bilateral;   Social History:  reports that she has never smoked. She has never used smokeless tobacco. She reports that she does not currently use alcohol after a past usage of about 2.0 standard drinks per week. She reports that she does not use drugs.  Allergies  Allergen Reactions   Other [facility]'s-was witnessed by RN in [place] around and under armband after placement.   Latex Itching and Rash    Family History  Problem Relation Age of Onset   Heart murmur Sister    Diabetes Maternal Grandfather     Prior to Admission medications   Not  on File    Physical Exam: Vitals:   08/23/21 0309 08/23/21 0610 08/23/21 0612 08/23/21 0811  BP: 130/74 (!) 92/56 114/72 (!) 115/49  Pulse: 92 87 83 93  Resp: 18   16  Temp: 99.1 F (37.3 C)     TempSrc: Oral     SpO2: 98% 98% 98% 98%  Weight:      Height:        Physical exam:  General: Not in acute distress HEENT: has swelling, tenderness, warmth in left face and left  withdrawal, with trismus, difficult to do exam intraorally       Eyes: PERRL, EOMI, no scleral icterus.       ENT: No discharge from the ears and nose, no pharynx injection, no tonsillar enlargement.        Neck: No JVD, no bruit, no mass felt. Heme: No neck lymph node enlargement. Cardiac: S1/S2, RRR, No murmurs, No gallops or rubs. Respiratory: No rales, wheezing, rhonchi or rubs. GI: Soft, nondistended, nontender, no rebound pain, no organomegaly, BS present. GU: No hematuria Ext: No pitting leg edema bilaterally. 1+DP/PT pulse bilaterally. Musculoskeletal: No joint deformities, No joint redness or warmth, no limitation of ROM in spin. Skin: No rashes.  Neuro: Alert, oriented X3, cranial nerves II-XII grossly intact, moves all extremities normally. Psych: Patient is not psychotic, no suicidal or hemocidal ideation.   Assessment/Plan * Facial cellulitis- (present on admission) CT scan showed cellulitis and myositis of masseter muscle, with 1.6 cm of pre-abscess, but no drainable.  Dr. Tami Ribas of ENT is consulted by EDP, who reviewed CT scan, did not think patient has drainable abscess.  He recommended to treat the patient with Unasyn 3 g every 6 hours and Decadron 10 mg IV every 8 hours. Pt does not meet criteria for sepsis currently.  - Admitted to MedSurg bed as inpatient - Unasyn 3 g every 6 hours and Decadron 10 mg IV every 8 hours. - PRN Zofran for nausea, morphine and Percocet for pain - Blood cultures x 2  - ESR and CRP - will get Procalcitonin and trend lactic acid levels  - IVF: 2L of NS bolus    Myositis of masseter muslcle- (present on admission) See A&P under facial cellulitis  Obesity, Class III, BMI 40-49.9 (morbid obesity) ([place])- (present on admission) -Diet and exercise.   -Encouraged to lose weight.      Advance Care Planning:   Code Status: Full Code   Consults: ENT, Dr. Tami Ribas  Family Communication: yes, her sister is at bedside  Severity of  Illness: The appropriate patient status for this patient is INPATIENT. Inpatient status is judged to be reasonable and necessary in order to provide the required intensity of service to ensure the patient's safety. The patient's presenting symptoms, physical exam findings, and initial radiographic and laboratory data in the context of their chronic comorbidities is felt to place them at high risk for further clinical deterioration. Furthermore, it is not anticipated that the patient will be medically stable for discharge from the hospital within 2 midnights of admission.   * I certify that at the point of admission it is my clinical judgment that the patient will require inpatient hospital care spanning beyond 2 midnights from the point of admission due to high intensity of service, high risk for further deterioration and high frequency of surveillance required.*  Patient presents with left facial cellulitis and myositis of masseter muscle, at high risk of developing sepsis.  Patient has trismus, difficult to  eat food, will need IV fluid.  Her presentation is highly complicated.  We need to be treated in hospital for at least 2 days.    Author: Ivor Costa, MD 08/23/2021 8:32 AM  For on call review [email or web address].

## 2021-08-23 NOTE — Assessment & Plan Note (Signed)
-  Diet and exercise.   °-Encouraged to lose weight.  ° °

## 2021-08-23 NOTE — Assessment & Plan Note (Signed)
See A&P under facial cellulitis

## 2021-08-23 NOTE — ED Provider Notes (Signed)
[facility] Provider Note    Event Date/Time   First MD Initiated Contact with Patient 08/23/21 [id number]     (approximate)   History   Dental Pain   HPI  Pamela Mack is a 26 y.o. female with no significant past medical history presents with facial swelling and dental pain.  Patient had a left lower molar removed 4 days ago.  Over the last 2 days she has developed progressive swelling and pain of the left jaw and face.  She is now having pain to the entire face and left thigh and pain rating down to her left neck to her shoulder.  She was not able to eat or drink today.  Is having difficulty swallowing.  No voice change.  Denies fevers or chills.  Notes that the vision in her left eye feels blurred, but she denies diplopia.    Past Medical History:  Diagnosis Date   Anemia affecting pregnancy in third trimester 02/09/2020   History of pre-eclampsia    Pre-eclampsia affecting childbirth    Pregnancy induced hypertension     Patient Active Problem List   Diagnosis Date Noted   Supervision of high risk pregnancy in third trimester 03/19/2020   Abdominal pain affecting pregnancy 03/12/2020   Anemia affecting pregnancy in third trimester 02/09/2020   History of 2 cesarean sections 09/26/2019   Supervision of other normal pregnancy, antepartum 09/26/2019   History of pre-eclampsia in prior pregnancy, currently pregnant 09/26/2019   Obesity affecting pregnancy in first trimester 09/26/2019     Physical Exam  Triage Vital Signs: ED Triage Vitals  Enc Vitals Group     BP 08/23/21 0309 130/74     Pulse Rate 08/23/21 0309 92     Resp 08/23/21 0309 18     Temp 08/23/21 0309 99.1 F (37.3 C)     Temp Source 08/23/21 0309 Oral     SpO2 08/23/21 0309 98 %     Weight 08/23/21 0308 228 lb (103.4 kg)     Height 08/23/21 0308 5' (1.524 m)     Head Circumference --      Peak Flow --      Pain Score 08/23/21 0320 10     Pain Loc --      Pain Edu? --       Excl. in GC? --     Most recent vital signs: Vitals:   08/23/21 0309  BP: 130/74  Pulse: 92  Resp: 18  Temp: 99.1 F (37.3 C)  SpO2: 98%     General: Awake, patient appears uncomfortable CV:  Good peripheral perfusion.  Resp:  Normal effort.  Abd:  No distention.  Neuro:             Awake, Alert, Oriented x 3  Other:  Significant swelling in the left submandibular region and along the left mandible extending up to the cheek, tender to palpation, no significant sublingual tenderness There is trismus  Bilateral conjunctival injection, pupils equal round reactive to light and accommodation, extraocular movements intact, no pain with extraocular movements   ED Results / Procedures / Treatments  Labs (all labs ordered are listed, but only abnormal results are displayed) Labs Reviewed  BASIC METABOLIC PANEL - Abnormal; Notable for the following components:      Result Value   Glucose, Bld 116 (*)    All other components within normal limits  CBC WITH DIFFERENTIAL/PLATELET - Abnormal; Notable for the following components:   WBC  10.7 (*)    Neutro Abs 8.0 (*)    All other components within normal limits  RESP PANEL BY RT-PCR (FLU A&B, COVID) ARPGX2  HCG, QUANTITATIVE, PREGNANCY  POC URINE PREG, ED     EKG     RADIOLOGY    PROCEDURES:  Critical Care performed: No  Procedures  The patient is on the cardiac monitor to evaluate for evidence of arrhythmia and/or significant heart rate changes.   MEDICATIONS ORDERED IN ED: Medications  Ampicillin-Sulbactam (UNASYN) 3 g in sodium chloride 0.9 % 100 mL IVPB (0 g Intravenous Stopped 08/23/21 0441)  sodium chloride 0.9 % bolus 1,000 mL (1,000 mLs Intravenous Bolus 08/23/21 0412)  dexamethasone (DECADRON) injection 10 mg (10 mg Intravenous Given 08/23/21 0411)  morphine 4 MG/ML injection 4 mg (4 mg Intravenous Given 08/23/21 0422)  iohexol (OMNIPAQUE) 300 MG/ML solution 75 mL (75 mLs Intravenous Contrast Given 08/23/21  0522)     IMPRESSION / MDM / ASSESSMENT AND PLAN / ED COURSE  I reviewed the triage vital signs and the nursing notes.                              Differential diagnosis includes, but is not limited to, dental abscess, cellulitis, deep space infection, Ludwig's angina  Patient is a 26 year old female presents with progressive swelling of the left side of her face after having a left lower molar removed 4 days ago.  On exam she has fairly impressive swelling in the left submandibular region extending to the cheek.  She has associated trismus.  Patient also complaining of some blurred vision of the left eye however on exam there is no restriction of extraocular movements and she has no pain with extraocular movements.  Suspicion for cavernous sinus thrombosis or other intracranial spread.  Patient also complaining of some pain in the left neck and shoulder which I suspect is referred pain.  I am concerned about deep space infection given the degree of swelling and trismus.  Exam really not consistent with Ludwick's.  We will place an IV and give IV Unasyn and Decadron and fluids.  We will obtain a CT max face as well as CT soft tissue neck given the referred neck pain just to rule out Lemierre's syndrome or further spread to the soft tissues in the neck.  Anticipate admission.  Labs are notable for mild leukocytosis, BMP within normal limits.  CT reviewed by myself and radiology, there is cellulitis and myositis of the masseter with a small area of hypoenhancement which may be consistent with a preabscess but no drainable fluid collection.  Will admit with IV antibiotics.  Discussed with hospitalist for admission as well as Dr. Aundra Dubin with ENT.  Clinical Course as of 08/23/21 0602  Fri Aug 23, 2021  0435 Preg Test, Ur: Negative [KM]  0440 WBC(!): 10.7 [KM]    Clinical Course User Index [KM] Rada Hay, MD     FINAL CLINICAL IMPRESSION(S) / ED DIAGNOSES   Final diagnoses:  Facial  cellulitis  Dental infection     Rx / DC Orders   ED Discharge Orders     None        Note:  This document was prepared using Dragon voice recognition software and may include unintentional dictation errors.   Rada Hay, MD 08/23/21 [phone]

## 2021-08-23 NOTE — Consult Note (Signed)
Pharmacy Antibiotic Note  Pamela Mack is a 26 y.o. female admitted on 08/23/2021 with cellulitis.  Pharmacy has been consulted for unasyn dosing.  Plan: Unasyn 3gm IV every 6 hours   Height: 5' (152.4 cm) Weight: 103.4 kg (228 lb) IBW/kg (Calculated) : 45.5  Temp (24hrs), Avg:99.1 F (37.3 C), Min:99.1 F (37.3 C), Max:99.1 F (37.3 C)  Recent Labs  Lab 08/23/21 0409  WBC 10.7*  CREATININE 0.61    Estimated Creatinine Clearance: 114.6 mL/min (by C-G formula based on SCr of 0.61 mg/dL).    Allergies  Allergen Reactions   Other [facility] Armband's-was witnessed by RN in [facility]-Hives around and under armband after placement.   Latex Itching and Rash    Antimicrobials this admission: 1/27 Unasyn >>   Dose adjustments this admission:  Microbiology results: 1/27 BCx: sent   Thank you for allowing pharmacy to be a part of this patients care.  Selinda Eon 08/23/2021 7:35 AM

## 2021-08-23 NOTE — ED Triage Notes (Signed)
Pt arrived via POV with reports of molar removed 4 days ago on the L side, pt reports swelling started yesterday and c/o pain to L eye, ear, head and L arm.

## 2021-08-23 NOTE — ED Notes (Signed)
Dr Clyde Lundborg was at bedside this morning.

## 2021-08-24 LAB — CBC
HCT: 37.3 % (ref 36.0–46.0)
Hemoglobin: 12.6 g/dL (ref 12.0–15.0)
MCH: 29.2 pg (ref 26.0–34.0)
MCHC: 33.8 g/dL (ref 30.0–36.0)
MCV: 86.3 fL (ref 80.0–100.0)
Platelets: 321 10*3/uL (ref 150–400)
RBC: 4.32 MIL/uL (ref 3.87–5.11)
RDW: 12 % (ref 11.5–15.5)
WBC: 19.3 10*3/uL — ABNORMAL HIGH (ref 4.0–10.5)
nRBC: 0 % (ref 0.0–0.2)

## 2021-08-24 MED ORDER — IBUPROFEN 400 MG PO TABS: 400.0000 mg | ORAL_TABLET | Freq: Four times a day (QID) | ORAL | Status: DC | PRN

## 2021-08-24 MED ORDER — IBUPROFEN 400 MG PO TABS: 400.0000 mg | ORAL_TABLET | Freq: Three times a day (TID) | ORAL | 0 refills | Status: AC

## 2021-08-24 MED ORDER — AMOXICILLIN-POT CLAVULANATE 875-125 MG PO TABS: 1.0000 | ORAL_TABLET | Freq: Two times a day (BID) | ORAL | 0 refills | Status: AC

## 2021-08-24 MED ORDER — PREDNISONE 10 MG PO TABS: ORAL_TABLET | ORAL | 0 refills | Status: AC

## 2021-08-24 NOTE — Progress Notes (Signed)
Pt being discharged home, discharge instructions reviewed with pt and sister, states understanding, pt with no complaints

## 2021-08-24 NOTE — Discharge Instructions (Signed)
Patient advised to follow-up with her dentist in a week to 10 days. If she is unable to then she can contact ENT Dr. Jenne Campus on the phone number provided.

## 2021-08-24 NOTE — Progress Notes (Signed)
Met with the patient and her sister in the room at the bedside, her sister provided the translation for Spanish, The patient lives at home with her 3 children, she has family support in place, She drives however the family also will provide transportation,  She does not have a PCP and no Ins I provided her with the [facility] application in Spanish and sent over the referral thru secure email I explained to her to call for an application appointment She stated understanding I provided her with the Good RX card and explained if she had monthly meds that she could also go to the [facility] pharmacy to get them without ins and use the same application She stated understanding No additional needs at this time

## 2021-08-24 NOTE — Discharge Summary (Signed)
[place] [facility] [place] NAME: Pamela Mack    MR#:  [id number]  DATE OF BIRTH:  October 31, 1995  DATE OF ADMISSION:  08/23/2021 ADMITTING PHYSICIAN: Ivor Costa, MD  DATE OF DISCHARGE: 08/24/2021  PRIMARY CARE PHYSICIAN: Patient, No Pcp Per (Inactive)    ADMISSION DIAGNOSIS:  Dental infection [K04.7] Facial cellulitis [L03.211]  DISCHARGE DIAGNOSIS:  left facial cellulitis post left lower molar extraction  SECONDARY DIAGNOSIS:   Past Medical History:  Diagnosis Date   Anemia affecting pregnancy in third trimester 02/09/2020   History of pre-eclampsia    Pre-eclampsia affecting childbirth    Pregnancy induced hypertension     HOSPITAL COURSE:   Pamela Mack is a 26 y.o. female with medical history significant of preeclampsia, obesity, who presents with left facial pain and swelling. Per patient  she had one of left lower molar teeth removed 4 days ago. She developed pain and swelling in left face and left jaw, which has been progressively worsening.  CT-neck soft tissue: Cellulitis and masseter myositis adjacent to the recent left-sided tooth extraction. There is 16 mm area of hypoenhancement in the left masseter most consistent with pre-abscess. No drainable collection or floor of mouth swelling.  Facial cellulitis left status post left lower molar extraction -- patient was started on IV unasyn, IV Decadron. She feels a lot better. Able to open her mouth much better. No issues with swallowing. Tolerated soft diet this morning. -- Discuss with ENT Dr. Tami Ribas-- will discharged on oral Augmentin and prednisone. Ibuprofen TID for couple days and then PRN -- patient advised to follow-up with her dentist if she is unable to reach then follow-up with Dr. Tami Ribas.  Obesity, Class III, BMI 40-49.9 (morbid obesity) ([place])- (present on admission) -Diet and exercise.   -Encouraged to lose weight.   Patient hemodynamically stable. Above was  discussed with patient's sister who was at bedside. Discharge plan was discussed via video interpreter  CONSULTS OBTAINED:  Treatment Team:  Beverly Gust, MD  DRUG ALLERGIES:   Allergies  Allergen Reactions   Other [facility] Armband's-was witnessed by RN in [place] around and under armband after placement.   Latex Itching and Rash    DISCHARGE MEDICATIONS:   Allergies as of 08/24/2021       Reactions   Other [facility] Armband's-was witnessed by RN in [place] around and under armband after placement.   Latex Itching, Rash        Medication List     TAKE these medications    amoxicillin-clavulanate 875-125 MG tablet Commonly known as: Augmentin Take 1 tablet by mouth 2 (two) times daily for 9 days.   ibuprofen 400 MG tablet Commonly known as: ADVIL Take 1 tablet (400 mg total) by mouth 3 (three) times daily for 4 days. And then take as needed once swelling goes down   predniSONE 10 MG tablet Commonly known as: DELTASONE Take 60 mg daily taper by 5 mg daily then stop        If you experience worsening of your admission symptoms, develop shortness of breath, life threatening emergency, suicidal or homicidal thoughts you must seek medical attention immediately by calling 911 or calling your MD immediately  if symptoms less severe.  You Must read complete instructions/literature along with all the possible adverse reactions/side effects for all the Medicines you take and that have been prescribed to you. Take any new Medicines after you  have completely understood and accept all the possible adverse reactions/side effects.   Please note  You were cared for by a hospitalist during your hospital stay. If you have any questions about your discharge medications or the care you received while you were in the hospital after you are discharged, you can call the unit and asked to speak with the hospitalist on call if the  hospitalist that took care of you is not available. Once you are discharged, your primary care physician will handle any further medical issues. Please note that NO REFILLS for any discharge medications will be authorized once you are discharged, as it is imperative that you return to your primary care physician (or establish a relationship with a primary care physician if you do not have one) for your aftercare needs so that they can reassess your need for medications and monitor your lab values. Today   SUBJECTIVE  Feels better Ate soft BF today. No issues Via interpreter (video)   VITAL SIGNS:  Blood pressure 105/69, pulse 86, temperature 98.3 F (36.8 C), temperature source Oral, resp. rate 18, height 5' (1.524 m), weight 103.4 kg, last menstrual period 08/22/2021, SpO2 100 %, unknown if currently breastfeeding.  I/O:   Intake/Output Summary (Last 24 hours) at 08/24/2021 1113 Last data filed at 08/24/2021 1100 Gross per 24 hour  Intake 537.2 ml  Output --  Net 537.2 ml    PHYSICAL EXAMINATION:  GENERAL:  26 y.o.-year-old patient lying in the bed with no acute distress. obesity Left facial swelling--mild tenderness lower jaw LUNGS: Normal breath sounds bilaterally, no wheezing, rales,rhonchi or crepitation.  CARDIOVASCULAR: S1, S2 normal. No murmurs, rubs, or gallops.  ABDOMEN: Soft, non-tender, non-distended. Bowel sounds present.  EXTREMITIES: No  clubbing.   DATA REVIEW:   CBC  Recent Labs  Lab 08/24/21 0604  WBC 19.3*  HGB 12.6  HCT 37.3  PLT 321    Chemistries  Recent Labs  Lab 08/23/21 0409  NA 135  K 3.5  CL 105  CO2 25  GLUCOSE 116*  BUN 9  CREATININE 0.61  CALCIUM 9.0    Microbiology Results   Recent Results (from the past 240 hour(s))  Resp Panel by RT-PCR (Flu A&B, Covid) Nasopharyngeal Swab     Status: None   Collection Time: 08/23/21  4:09 AM   Specimen: Nasopharyngeal Swab; Nasopharyngeal(NP) swabs in vial transport medium  Result Value  Ref Range Status   SARS Coronavirus 2 by RT PCR NEGATIVE NEGATIVE Final    Comment: (NOTE) SARS-CoV-2 target nucleic acids are NOT DETECTED.  The SARS-CoV-2 RNA is generally detectable in upper respiratory specimens during the acute phase of infection. The lowest concentration of SARS-CoV-2 viral copies this assay can detect is 138 copies/mL. A negative result does not preclude SARS-Cov-2 infection and should not be used as the sole basis for treatment or other patient management decisions. A negative result may occur with  improper specimen collection/handling, submission of specimen other than nasopharyngeal swab, presence of viral mutation(s) within the areas targeted by this assay, and inadequate number of viral copies(<138 copies/mL). A negative result must be combined with clinical observations, patient history, and epidemiological information. The expected result is Negative.  Fact Sheet for Patients:  EntrepreneurPulse.com.au  Fact Sheet for Healthcare Providers:  IncredibleEmployment.be  This test is no t yet approved or cleared by the [place] FDA and  has been authorized for detection and/or diagnosis of SARS-CoV-2 by FDA under an Emergency Use Authorization (EUA). This EUA will  remain  in effect (meaning this test can be used) for the duration of the COVID-19 declaration under Section 564(b)(1) of the Act, 21 U.S.C.section 360bbb-3(b)(1), unless the authorization is terminated  or revoked sooner.       Influenza A by PCR NEGATIVE NEGATIVE Final   Influenza B by PCR NEGATIVE NEGATIVE Final    Comment: (NOTE) The Xpert Xpress SARS-CoV-2/FLU/RSV plus assay is intended as an aid in the diagnosis of influenza from Nasopharyngeal swab specimens and should not be used as a sole basis for treatment. Nasal washings and aspirates are unacceptable for Xpert Xpress SARS-CoV-2/FLU/RSV testing.  Fact Sheet for  Patients: EntrepreneurPulse.com.au  Fact Sheet for Healthcare Providers: IncredibleEmployment.be  This test is not yet approved or cleared by the [place] FDA and has been authorized for detection and/or diagnosis of SARS-CoV-2 by FDA under an Emergency Use Authorization (EUA). This EUA will remain in effect (meaning this test can be used) for the duration of the COVID-19 declaration under Section 564(b)(1) of the Act, 21 U.S.C. section 360bbb-3(b)(1), unless the authorization is terminated or revoked.  Performed at [facility], [address] [place], [place] [address]   Culture, blood (x 2)     Status: None (Preliminary result)   Collection Time: 08/23/21  7:41 AM   Specimen: BLOOD  Result Value Ref Range Status   Specimen Description BLOOD BLOOD RIGHT HAND  Final   Special Requests   Final    BOTTLES DRAWN AEROBIC AND ANAEROBIC Blood Culture results may not be optimal due to an excessive volume of blood received in culture bottles   Culture   Final    NO GROWTH < 24 HOURS Performed at [facility], [address], [place], [place] [address]    Report Status PENDING  Incomplete  Culture, blood (x 2)     Status: None (Preliminary result)   Collection Time: 08/23/21  7:41 AM   Specimen: BLOOD  Result Value Ref Range Status   Specimen Description BLOOD LEFT ANTECUBITAL  Final   Special Requests   Final    BOTTLES DRAWN AEROBIC AND ANAEROBIC Blood Culture results may not be optimal due to an excessive volume of blood received in culture bottles   Culture   Final    NO GROWTH < 24 HOURS Performed at [facility], [address] [place], [place] [address]    Report Status PENDING  Incomplete    RADIOLOGY:  CT Soft Tissue Neck W Contrast  Result Date: 08/23/2021 CLINICAL DATA:  Soft tissue swelling with infection suspected. Molar removed 4 days ago on the left. EXAM: CT NECK WITH CONTRAST TECHNIQUE:  Multidetector CT imaging of the neck was performed using the standard protocol following the bolus administration of intravenous contrast. RADIATION DOSE REDUCTION: This exam was performed according to the departmental dose-optimization program which includes automated exposure control, adjustment of the mA and/or kV according to patient size and/or use of iterative reconstruction technique. CONTRAST:  30mL OMNIPAQUE IOHEXOL 300 MG/ML  SOLN COMPARISON:  None. FINDINGS: Pharynx and larynx: No evidence of mass or inflammation. Salivary glands: No inflammation, mass, or stone. Thyroid: Normal. Lymph nodes: Expected enlargement of cervical lymph nodes asymmetric to the left. Vascular: Negative.  No venous thrombosis Limited intracranial: Negative Visualized orbits: Negative Mastoids and visualized paranasal sinuses: Patchy mucosal thickening asymmetric to the left maxillary sinus. No sinus fluid levels. Partial bilateral mastoid opacification. Skeleton: Empty left lower mandibular alveolus with regional fat and masseter inflammation/swelling. Ovoid Hypoenhancement in the  adjacent masseter, measuring 16 mm diameter on axial images. No drainable collection. No floor of mouth mass effect. Upper chest: Negative IMPRESSION: Cellulitis and masseter myositis adjacent to the recent left-sided tooth extraction. There is 16 mm area of hypoenhancement in the left masseter most consistent with pre-abscess. No drainable collection or floor of mouth swelling. Electronically Signed   By: Jorje Guild M.D.   On: 08/23/2021 05:42     CODE STATUS:     Code Status Orders  (From admission, onward)           Start     Ordered   08/23/21 0721  Full code  Continuous        08/23/21 0720           Code Status History     Date Active Date Inactive Code Status Order ID Comments User Context   03/19/2020 0550 03/22/2020 0011 Full Code [id number]  Benjaman Kindler, MD Inpatient   03/12/2020 1727 03/12/2020 2248 Full Code  [id number]  Minda Meo, CNM Inpatient        TOTAL TIME TAKING CARE OF THIS PATIENT: 35 minutes.    Fritzi Mandes M.D  [facility]    CC: Primary care physician; Patient, No Pcp Per (Inactive)

## 2021-08-28 LAB — CULTURE, BLOOD (ROUTINE X 2)
Culture: NO GROWTH
Culture: NO GROWTH

## 2023-04-03 ENCOUNTER — Encounter: Payer: Self-pay | Admitting: Emergency Medicine

## 2023-04-03 ENCOUNTER — Emergency Department: Payer: Self-pay

## 2023-04-03 ENCOUNTER — Emergency Department
Admission: EM | Admit: 2023-04-03 | Discharge: 2023-04-03 | Disposition: A | Discharge status: Home / Self Care | Payer: Self-pay | Attending: Emergency Medicine | Admitting: Emergency Medicine

## 2023-04-03 ENCOUNTER — Other Ambulatory Visit: Payer: Self-pay

## 2023-04-03 DIAGNOSIS — R1030 Lower abdominal pain, unspecified: Secondary | ICD-10-CM | POA: Insufficient documentation

## 2023-04-03 LAB — URINALYSIS, ROUTINE W REFLEX MICROSCOPIC
Bilirubin Urine: NEGATIVE
Glucose, UA: NEGATIVE mg/dL
Ketones, ur: NEGATIVE mg/dL
Leukocytes,Ua: NEGATIVE
Nitrite: NEGATIVE
Protein, ur: NEGATIVE mg/dL
Specific Gravity, Urine: 1.016 (ref 1.005–1.030)
pH: 7 (ref 5.0–8.0)

## 2023-04-03 LAB — CBC
HCT: 36.3 % (ref 36.0–46.0)
Hemoglobin: 13.1 g/dL (ref 12.0–15.0)
MCH: 30.7 pg (ref 26.0–34.0)
MCHC: 36.1 g/dL — ABNORMAL HIGH (ref 30.0–36.0)
MCV: 85 fL (ref 80.0–100.0)
Platelets: 280 10*3/uL (ref 150–400)
RBC: 4.27 MIL/uL (ref 3.87–5.11)
RDW: 11.9 % (ref 11.5–15.5)
WBC: 7.2 10*3/uL (ref 4.0–10.5)
nRBC: 0 % (ref 0.0–0.2)

## 2023-04-03 LAB — COMPREHENSIVE METABOLIC PANEL
ALT: 13 U/L (ref 0–44)
AST: 15 U/L (ref 15–41)
Albumin: 3.8 g/dL (ref 3.5–5.0)
Alkaline Phosphatase: 44 U/L (ref 38–126)
Anion gap: 7 (ref 5–15)
BUN: 16 mg/dL (ref 6–20)
CO2: 25 mmol/L (ref 22–32)
Calcium: 8.8 mg/dL — ABNORMAL LOW (ref 8.9–10.3)
Chloride: 101 mmol/L (ref 98–111)
Creatinine, Ser: 0.54 mg/dL (ref 0.44–1.00)
GFR, Estimated: >60 mL/min (ref >60)
Glucose, Bld: 106 mg/dL — ABNORMAL HIGH (ref 70–99)
Potassium: 3.6 mmol/L (ref 3.5–5.1)
Sodium: 133 mmol/L — ABNORMAL LOW (ref 135–145)
Total Bilirubin: 0.9 mg/dL (ref 0.3–1.2)
Total Protein: 6.9 g/dL (ref 6.5–8.1)

## 2023-04-03 LAB — HCG, QUANTITATIVE, PREGNANCY: hCG, Beta Chain, Quant, S: <1 m[IU]/mL (ref <5)

## 2023-04-03 LAB — LIPASE, BLOOD: Lipase: 42 U/L (ref 11–51)

## 2023-04-03 MED ORDER — LACTATED RINGERS IV BOLUS
1000.0000 mL | Freq: Once | INTRAVENOUS | Status: AC
Start: 2023-04-03 — End: 2023-04-03
  Administered 2023-04-03: 1000 mL via INTRAVENOUS

## 2023-04-03 MED ORDER — IOHEXOL 300 MG/ML  SOLN
100.0000 mL | Freq: Once | INTRAMUSCULAR | Status: AC | PRN
  Administered 2023-04-03: 100 mL via INTRAVENOUS

## 2023-04-03 MED ORDER — ONDANSETRON HCL 4 MG/2ML IJ SOLN
4.0000 mg | Freq: Once | INTRAMUSCULAR | Status: AC
  Administered 2023-04-03: 4 mg via INTRAVENOUS
  Filled 2023-04-03: qty 2

## 2023-04-03 MED ORDER — HYDROMORPHONE HCL 1 MG/ML IJ SOLN
1.0000 mg | Freq: Once | INTRAMUSCULAR | Status: AC
Start: 2023-04-03 — End: 2023-04-03
  Administered 2023-04-03: 1 mg via INTRAVENOUS
  Filled 2023-04-03: qty 1

## 2023-04-03 NOTE — ED Provider Notes (Signed)
[facility] Provider Note    Event Date/Time   First MD Initiated Contact with Patient 04/03/23 [phone]     (approximate)   History   Abdominal Pain   HPI  Pamela Mack is a 27 y.o. female who presents to the ED for evaluation of Abdominal Pain   Patient reports history of cesarean section x 3 as well as bilateral tubal ligation.  No other intra-abdominal surgeries.  Uncertain about ovarian cyst.  She presents to the ED for evaluation of severely worsening abdominal pain this morning after week of intermittent stuttering abdominal pain.  She reports severe bilateral lower abdominal/pelvic pain this morning alongside nausea.  More severe for the past 1-2 hours.  Denies any stool or urinary changes.  No vaginal discharge or bleeding.  Prior to the lesser severity stuttering pain earlier this week, she is never felt this before.   Physical Exam   Triage Vital Signs: ED Triage Vitals  Encounter Vitals Group     BP 04/03/23 0610 111/67     Systolic BP Percentile --      Diastolic BP Percentile --      Pulse Rate 04/03/23 0610 73     Resp 04/03/23 0610 19     Temp 04/03/23 0610 (!) 97.5 F (36.4 C)     Temp Source 04/03/23 0610 Axillary     SpO2 04/03/23 0610 100 %     Weight 04/03/23 0606 229 lb 11.5 oz (104.2 kg)     Height 04/03/23 0606 5' (1.524 m)     Head Circumference --      Peak Flow --      Pain Score 04/03/23 0606 8     Pain Loc --      Pain Education --      Exclude from Growth Chart --     Most recent vital signs: Vitals:   04/03/23 0610 04/03/23 0639  BP: 111/67 93/67  Pulse: 73 81  Resp: 19   Temp: (!) 97.5 F (36.4 C)   SpO2: 100% 99%    General: Awake, no distress.  CV:  Good peripheral perfusion.  Resp:  Normal effort.  Abd:  No distention.  Tenderness throughout the lower abdomen.  Benign upper abdomen.  No peritoneal features. MSK:  No deformity noted.  Neuro:  No focal deficits appreciated. Other:     ED  Results / Procedures / Treatments   Labs (all labs ordered are listed, but only abnormal results are displayed) Labs Reviewed  COMPREHENSIVE METABOLIC PANEL - Abnormal; Notable for the following components:      Result Value   Sodium 133 (*)    Glucose, Bld 106 (*)    Calcium 8.8 (*)    All other components within normal limits  CBC - Abnormal; Notable for the following components:   MCHC 36.1 (*)    All other components within normal limits  LIPASE, BLOOD  URINALYSIS, ROUTINE W REFLEX MICROSCOPIC  HCG, QUANTITATIVE, PREGNANCY  POC URINE PREG, ED    EKG   RADIOLOGY   Official radiology report(s): No results found.  PROCEDURES and INTERVENTIONS:  Procedures  Medications  HYDROmorphone (DILAUDID) injection 1 mg (1 mg Intravenous Given 04/03/23 0635)  ondansetron (ZOFRAN) injection 4 mg (4 mg Intravenous Given 04/03/23 0635)  lactated ringers bolus 1,000 mL (1,000 mLs Intravenous New Bag/Given 04/03/23 0635)     IMPRESSION / MDM / ASSESSMENT AND PLAN / ED COURSE  I reviewed the triage vital signs and  the nursing notes.  Differential diagnosis includes, but is not limited to, ovarian torsion, PID, appendicitis, diverticulitis, gastroenteritis or IBS  {Patient presents with symptoms of an acute illness or injury that is potentially life-threatening.  Patient presents with severe lower abdominal pain.  She has normal vital signs but does have localized tenderness.  Normal CBC, lipase and metabolic panel.  Awaiting pregnancy test as she has been unable to void yet.  To help elucidate imaging options I suspect she will require a torsion rule out and possibly a subsequent CT scan.      FINAL CLINICAL IMPRESSION(S) / ED DIAGNOSES   Final diagnoses:  None     Rx / DC Orders   ED Discharge Orders     None        Note:  This document was prepared using Dragon voice recognition software and may include unintentional dictation errors.   Delton Prairie, MD 04/03/23  [phone]

## 2023-04-03 NOTE — ED Triage Notes (Signed)
Pt presents ambulatory to triage via POV with complaints of LLQ pain that started ~ 20 mins ago that woke her up this AM. Per support person the patient has had N&V. Rates the pain 8/10 and is visibly uncomfortable in triage. A&Ox4 at this time. Denies CP or SOB.

## 2023-04-03 NOTE — ED Notes (Signed)
This RN to bedside to answer call light. Pt. Requested to get up to toilet. Pt. Provided non slip socks, up to toilet indep. Gait steady, NAD.

## 2023-04-03 NOTE — ED Provider Notes (Signed)
Patient received in signout from Dr. Katrinka Blazing pending follow-up imaging.  Her blood work is reassuring.  Imaging also without any evidence of acute findings.  She feels significantly improved in no acute distress and is asymptomatic at this time.  No findings to suggest need for inpatient management or further diagnostic testing does appear appropriate for outpatient follow-up.   Pamela Eddy, MD 04/03/23 [phone]

## 2023-04-03 NOTE — ED Notes (Signed)
Pt. To CT

## 2023-04-06 NOTE — Group Note (Deleted)

## 2024-03-29 ENCOUNTER — Other Ambulatory Visit: Payer: Self-pay

## 2024-03-29 ENCOUNTER — Emergency Department
Admission: EM | Admit: 2024-03-29 | Discharge: 2024-03-29 | Disposition: A | Discharge status: Home / Self Care | Payer: Self-pay | Attending: Emergency Medicine | Admitting: Emergency Medicine

## 2024-03-29 DIAGNOSIS — R059 Cough, unspecified: Secondary | ICD-10-CM | POA: Insufficient documentation

## 2024-03-29 DIAGNOSIS — H6091 Unspecified otitis externa, right ear: Secondary | ICD-10-CM | POA: Insufficient documentation

## 2024-03-29 LAB — RESP PANEL BY RT-PCR (RSV, FLU A&B, COVID)  RVPGX2
Influenza A by PCR: NEGATIVE
Influenza B by PCR: NEGATIVE
Resp Syncytial Virus by PCR: NEGATIVE
SARS Coronavirus 2 by RT PCR: NEGATIVE

## 2024-03-29 MED ORDER — OFLOXACIN 0.3 % OT SOLN: 5.0000 [drp] | Freq: Every day | OTIC | 0 refills | Status: AC

## 2024-03-29 NOTE — Discharge Instructions (Addendum)
 Please use the antibiotics as prescribed.  Give yourself at least 2 days to see some improvement.  If you are not getting better follow-up with the ear nose and throat specialist whose information is attached.  You can take 650 mg of Tylenol  and 600 mg of ibuprofen  every 6 hours as needed for pain. You can use ice, heat, muscle creams and other topical pain relievers as well.

## 2024-03-29 NOTE — ED Triage Notes (Signed)
 Pt reports intermittent right ear pain x1 month, pt states 2 days ago she developed cough and fever.

## 2024-03-29 NOTE — ED Provider Notes (Signed)
 [facility] Provider Note    Event Date/Time   First MD Initiated Contact with Patient 03/29/24 2040     (approximate)   History   Otalgia   HPI  Pamela Mack is a 28 y.o. female with no PMH who presents for evaluation of right ear pain ongoing for about a month.  Patient states that about a month ago she had sudden onset of sharp pain.  At that point she noticed some green and yellow drainage from her ear.  Over the past couple days her pain has returned and she developed a cough and fever.  She is also noticed a small amount of drainage from her ear.  She reports that the pain radiates across her forehead to her eye and down her jaw.      Physical Exam   Triage Vital Signs: ED Triage Vitals  Encounter Vitals Group     BP 03/29/24 2019 115/70     Girls Systolic BP Percentile --      Girls Diastolic BP Percentile --      Boys Systolic BP Percentile --      Boys Diastolic BP Percentile --      Pulse Rate 03/29/24 2019 90     Resp 03/29/24 2019 20     Temp 03/29/24 2020 98.5 F (36.9 C)     Temp Source 03/29/24 2019 Oral     SpO2 03/29/24 2019 100 %     Weight 03/29/24 2018 185 lb (83.9 kg)     Height 03/29/24 2018 5' 6 (1.676 m)     Head Circumference --      Peak Flow --      Pain Score 03/29/24 2018 0     Pain Loc --      Pain Education --      Exclude from Growth Chart --     Most recent vital signs: Vitals:   03/29/24 2019 03/29/24 2020  BP: 115/70   Pulse: 90   Resp: 20   Temp:  98.5 F (36.9 C)  SpO2: 100%    General: Awake, no distress.  CV:  Good peripheral perfusion.  Resp:  Normal effort.  Abd:  No distention.  Right ear:  Tender to palpation of the tragus, EAC is erythematous with white discharge, unable to visualize the TM Left ear: Cerumen within the [facility], unable to visualize TM   ED Results / Procedures / Treatments   Labs (all labs ordered are listed, but only abnormal results are displayed) Labs Reviewed   RESP PANEL BY RT-PCR (RSV, FLU A&B, COVID)  [id number]     PROCEDURES:  Critical Care performed: No  Procedures   MEDICATIONS ORDERED IN ED: Medications - No data to display   IMPRESSION / MDM / ASSESSMENT AND PLAN / ED COURSE  I reviewed the triage vital signs and the nursing notes.                             28 year old female presents for evaluation of right ear pain.  Vital signs are stable patient NAD on exam.  Differential diagnosis includes, but is not limited to, acute otitis media, acute otitis externa, cholesteatoma, TM rupture, viral infection.  Patient's presentation is most consistent with acute, uncomplicated illness.  Respiratory panel is negative.  Right EAC is erythematous with discharge and patient had a very difficult time tolerating otoscopic exam.  This is consistent with acute  otitis externa.  Will start patient on antibiotic eardrops.  Recommended Tylenol  and ibuprofen  as needed for pain.  Will place referral for primary care provider.  Did advise follow-up with ENT if no improvement.  Patient voiced understanding, all questions were answered and she was stable at discharge.     FINAL CLINICAL IMPRESSION(S) / ED DIAGNOSES   Final diagnoses:  Otitis externa of right ear, unspecified chronicity, unspecified type     Rx / DC Orders   ED Discharge Orders          Ordered    ofloxacin  (FLOXIN ) 0.3 % OTIC solution  Daily        03/29/24 2219    Ambulatory Referral to Primary Care (Establish Care)        03/29/24 2221             Note:  This document was prepared using Dragon voice recognition software and may include unintentional dictation errors.   Cleaster Tinnie LABOR, PA-C 03/29/24 2221    Waymond Lorelle Cummins, MD 03/29/24 2229
